# Patient Record
Sex: Male | Born: 1950 | Race: White | Hispanic: No | Marital: Single | Smoking: Never smoker
Health system: Southern US, Community
[De-identification: ages and names within clinical notes are randomized; demographics above are authoritative.]

## PROBLEM LIST (undated history)

## (undated) DIAGNOSIS — R0989 Other specified symptoms and signs involving the circulatory and respiratory systems: Secondary | ICD-10-CM

## (undated) DIAGNOSIS — C4490 Unspecified malignant neoplasm of skin, unspecified: Secondary | ICD-10-CM

## (undated) HISTORY — PX: OTHER SURGICAL HISTORY: SHX169

## (undated) HISTORY — DX: Unspecified malignant neoplasm of skin, unspecified: C44.90

## (undated) HISTORY — DX: Other specified symptoms and signs involving the circulatory and respiratory systems: R09.89

## (undated) HISTORY — PX: CATARACT EXTRACTION: SUR2

---

## 2011-03-29 ENCOUNTER — Encounter: Payer: Self-pay | Admitting: Podiatry

## 2011-03-29 DIAGNOSIS — R0989 Other specified symptoms and signs involving the circulatory and respiratory systems: Secondary | ICD-10-CM

## 2011-03-29 DIAGNOSIS — C4490 Unspecified malignant neoplasm of skin, unspecified: Secondary | ICD-10-CM

## 2011-03-29 DIAGNOSIS — M7750 Other enthesopathy of unspecified foot: Secondary | ICD-10-CM

## 2015-08-05 DIAGNOSIS — G4700 Insomnia, unspecified: Secondary | ICD-10-CM | POA: Insufficient documentation

## 2015-08-05 DIAGNOSIS — M25571 Pain in right ankle and joints of right foot: Secondary | ICD-10-CM | POA: Insufficient documentation

## 2015-08-05 DIAGNOSIS — R6889 Other general symptoms and signs: Secondary | ICD-10-CM | POA: Insufficient documentation

## 2015-08-05 DIAGNOSIS — E663 Overweight: Secondary | ICD-10-CM | POA: Insufficient documentation

## 2015-08-05 DIAGNOSIS — M19079 Primary osteoarthritis, unspecified ankle and foot: Secondary | ICD-10-CM | POA: Insufficient documentation

## 2015-08-05 DIAGNOSIS — R5381 Other malaise: Secondary | ICD-10-CM | POA: Insufficient documentation

## 2015-08-05 DIAGNOSIS — L570 Actinic keratosis: Secondary | ICD-10-CM | POA: Insufficient documentation

## 2015-08-05 DIAGNOSIS — D179 Benign lipomatous neoplasm, unspecified: Secondary | ICD-10-CM | POA: Insufficient documentation

## 2015-08-05 DIAGNOSIS — G2581 Restless legs syndrome: Secondary | ICD-10-CM | POA: Insufficient documentation

## 2015-08-05 DIAGNOSIS — E785 Hyperlipidemia, unspecified: Secondary | ICD-10-CM | POA: Insufficient documentation

## 2015-08-05 DIAGNOSIS — R5383 Other fatigue: Secondary | ICD-10-CM | POA: Insufficient documentation

## 2015-08-05 DIAGNOSIS — Z789 Other specified health status: Secondary | ICD-10-CM | POA: Insufficient documentation

## 2015-10-30 DIAGNOSIS — I861 Scrotal varices: Secondary | ICD-10-CM | POA: Insufficient documentation

## 2015-10-30 DIAGNOSIS — F988 Other specified behavioral and emotional disorders with onset usually occurring in childhood and adolescence: Secondary | ICD-10-CM | POA: Insufficient documentation

## 2015-10-30 DIAGNOSIS — I8393 Asymptomatic varicose veins of bilateral lower extremities: Secondary | ICD-10-CM | POA: Insufficient documentation

## 2015-10-30 DIAGNOSIS — F411 Generalized anxiety disorder: Secondary | ICD-10-CM | POA: Insufficient documentation

## 2016-10-12 DIAGNOSIS — Z0000 Encounter for general adult medical examination without abnormal findings: Secondary | ICD-10-CM | POA: Insufficient documentation

## 2016-10-27 DIAGNOSIS — F4322 Adjustment disorder with anxiety: Secondary | ICD-10-CM | POA: Insufficient documentation

## 2017-04-15 DIAGNOSIS — H93A2 Pulsatile tinnitus, left ear: Secondary | ICD-10-CM | POA: Insufficient documentation

## 2017-05-07 DIAGNOSIS — H903 Sensorineural hearing loss, bilateral: Secondary | ICD-10-CM | POA: Insufficient documentation

## 2017-06-11 ENCOUNTER — Other Ambulatory Visit: Payer: Self-pay | Admitting: Family Medicine

## 2017-06-11 DIAGNOSIS — R161 Splenomegaly, not elsewhere classified: Secondary | ICD-10-CM

## 2017-06-13 ENCOUNTER — Ambulatory Visit
Admission: RE | Admit: 2017-06-13 | Discharge: 2017-06-13 | Disposition: A | Discharge status: Home / Self Care | Payer: Medicare Other | Source: Ambulatory Visit | Attending: Family Medicine | Admitting: Family Medicine

## 2017-06-13 DIAGNOSIS — R161 Splenomegaly, not elsewhere classified: Secondary | ICD-10-CM

## 2017-06-13 MED ORDER — IOPAMIDOL (ISOVUE-300) INJECTION 61%
100.0000 mL | Freq: Once | INTRAVENOUS | Status: AC | PRN
  Administered 2017-06-13: 100 mL via INTRAVENOUS

## 2017-06-18 ENCOUNTER — Telehealth: Payer: Self-pay | Admitting: Hematology

## 2017-06-18 NOTE — Telephone Encounter (Signed)
An urgent referral has been sent from [place] at [facility]. An appt has been scheduled for the pt to see Dr. Irene Limbo on 10/25 at 11am.

## 2017-06-21 ENCOUNTER — Ambulatory Visit (HOSPITAL_BASED_OUTPATIENT_CLINIC_OR_DEPARTMENT_OTHER): Payer: Medicare Other | Admitting: Hematology

## 2017-06-21 ENCOUNTER — Telehealth: Payer: Self-pay | Admitting: Hematology

## 2017-06-21 ENCOUNTER — Ambulatory Visit (HOSPITAL_BASED_OUTPATIENT_CLINIC_OR_DEPARTMENT_OTHER): Payer: Medicare Other

## 2017-06-21 ENCOUNTER — Encounter: Payer: Self-pay | Admitting: Hematology

## 2017-06-21 ENCOUNTER — Other Ambulatory Visit (HOSPITAL_COMMUNITY)
Admission: RE | Admit: 2017-06-21 | Discharge: 2017-06-21 | Disposition: A | Discharge status: Home / Self Care | Payer: Medicare Other | Source: Ambulatory Visit | Attending: Hematology | Admitting: Hematology

## 2017-06-21 VITALS — BP 164/79 | HR 96 | Temp 98.1°F | Resp 18 | Ht 71.0 in | Wt 171.7 lb

## 2017-06-21 DIAGNOSIS — R5383 Other fatigue: Secondary | ICD-10-CM

## 2017-06-21 DIAGNOSIS — Z8052 Family history of malignant neoplasm of bladder: Secondary | ICD-10-CM | POA: Diagnosis not present

## 2017-06-21 DIAGNOSIS — Z809 Family history of malignant neoplasm, unspecified: Secondary | ICD-10-CM

## 2017-06-21 DIAGNOSIS — D72820 Lymphocytosis (symptomatic): Secondary | ICD-10-CM

## 2017-06-21 DIAGNOSIS — R161 Splenomegaly, not elsewhere classified: Secondary | ICD-10-CM

## 2017-06-21 DIAGNOSIS — D696 Thrombocytopenia, unspecified: Secondary | ICD-10-CM | POA: Diagnosis not present

## 2017-06-21 DIAGNOSIS — D649 Anemia, unspecified: Secondary | ICD-10-CM | POA: Diagnosis not present

## 2017-06-21 LAB — CBC & DIFF AND RETIC
BASO%: 0.1 % (ref 0.0–2.0)
Basophils Absolute: 0 10*3/uL (ref 0.0–0.1)
EOS%: 0.3 % (ref 0.0–7.0)
Eosinophils Absolute: 0 10*3/uL (ref 0.0–0.5)
HCT: 35.4 % — ABNORMAL LOW (ref 38.4–49.9)
HGB: 11.4 g/dL — ABNORMAL LOW (ref 13.0–17.1)
Immature Retic Fract: 18.2 % — ABNORMAL HIGH (ref 3.00–10.60)
LYMPH#: 9.3 10*3/uL — AB (ref 0.9–3.3)
LYMPH%: 66.4 % — ABNORMAL HIGH (ref 14.0–49.0)
MCH: 29.6 pg (ref 27.2–33.4)
MCHC: 32.2 g/dL (ref 32.0–36.0)
MCV: 91.9 fL (ref 79.3–98.0)
MONO#: 0.7 10*3/uL (ref 0.1–0.9)
MONO%: 4.9 % (ref 0.0–14.0)
NEUT%: 28.3 % — AB (ref 39.0–75.0)
NEUTROS ABS: 4 10*3/uL (ref 1.5–6.5)
PLATELETS: 96 10*3/uL — AB (ref 140–400)
RBC: 3.85 10*6/uL — AB (ref 4.20–5.82)
RDW: 17 % — ABNORMAL HIGH (ref 11.0–14.6)
RETIC CT ABS: 147.84 10*3/uL — AB (ref 34.80–93.90)
Retic %: 3.84 % — ABNORMAL HIGH (ref 0.80–1.80)
WBC: 14 10*3/uL — AB (ref 4.0–10.3)

## 2017-06-21 LAB — COMPREHENSIVE METABOLIC PANEL
ALT: 18 U/L (ref 0–55)
ANION GAP: 8 meq/L (ref 3–11)
AST: 23 U/L (ref 5–34)
Albumin: 3.7 g/dL (ref 3.5–5.0)
Alkaline Phosphatase: 79 U/L (ref 40–150)
BUN: 17.4 mg/dL (ref 7.0–26.0)
CHLORIDE: 105 meq/L (ref 98–109)
CO2: 27 meq/L (ref 22–29)
Calcium: 9.3 mg/dL (ref 8.4–10.4)
Creatinine: 0.9 mg/dL (ref 0.7–1.3)
EGFR: >60 mL/min/{1.73_m2} (ref >60)
Glucose: 131 mg/dl (ref 70–140)
Potassium: 4.3 mEq/L (ref 3.5–5.1)
Sodium: 140 mEq/L (ref 136–145)
Total Bilirubin: 0.44 mg/dL (ref 0.20–1.20)
Total Protein: 8.1 g/dL (ref 6.4–8.3)

## 2017-06-21 LAB — CHCC SMEAR

## 2017-06-21 LAB — LACTATE DEHYDROGENASE: LDH: 174 U/L (ref 125–245)

## 2017-06-21 LAB — TECHNOLOGIST REVIEW

## 2017-06-21 NOTE — Telephone Encounter (Signed)
Gave avs and calendar for November  °

## 2017-06-21 NOTE — Progress Notes (Signed)
HEMATOLOGY/ONCOLOGY CONSULTATION NOTE  Date of Service: 06/21/2017  Patient Care Team: Christain Sacramento, MD as PCP - General (Family Medicine)  CHIEF COMPLAINTS/PURPOSE OF CONSULTATION:  Splenomegaly with lymphocytosis   HISTORY OF PRESENTING ILLNESS:  Jared Nixon is a wonderful 66 y.o. male who has been referred to [place] by Dr Redmond Pulling, [name], MD for evaluation and management of new splenomegaly with lymphocytosis.  Initially, the patient presented into his PCP's office on 06/11/17 complaining of a small lump under his left lower ribcage which had been present for several weeks. This was palpable, per his PCP's notes and Dr Redmond Pulling subsequently ordered a CT A/P to evaluate this further. This was performed on 06/13/17 which confirmed the presence of splenomegaly and portal caval lymphadenopathy which was concerning for lymphoproliferative disease. He also had lab work (summarized as below) which also showed some gradual leukocytosis/lymphocytosis, with worsening anemia and thrombocytopenia. He denies any pain over the area of his splenomegaly, difficulty eating or premature satiation.   In a general sense, he had not had an significant chronic health issues which he is being treated for. He does struggle with some tinnitus involving only the left ear which began around the end of July and has somewhat worsened since. One year ago he also had bilateral inguinal hernia surgery and a basal cell carcinoma excision. He also notes in the 80s that he had a gynecomastia removed, but otherwise of recently he has been feeling at his baseline state-of-health. He was initially informed that he suffered from leukocytosis earlier this year by Dr Dois Davenport office.   He did smoke for a brief time several decades ago in his 51s (0.5ppd), but none since. He does drink alcohol, and on average he drinks 7-8 beers a week. He denies any chemical/radiaiton exposures or exposure during his career. He does not partake in  illicit drug usage. He has never previously needed a blood transfusion in the past.   He is currently prescribed Clonazepam and Ritalin to use on a daily basis which are both managed by Christain Sacramento, MD. He takes these for dealing with clutter/hoarding issues ongoing within his personal life.   Of note, the patient reports that his father did have bladder cancer. His paternal Aunts all were diagnosed with cancer, but he is unsure of which types. He does have a h/o CVA on his mothers side, but nothing else of note.   On ROS, he does report a 10lbs weight loss which was observed by Dr Redmond Pulling and was attributed to his usage of Ritalin. He also notes that he does suffer from sleeping issues. He does experience fatigue intermittently, however, this is not debilitating or hindering his daily activities. He denies fever, chills, night sweats, abdominal pain, nausea, vomiting, diarrhea, rash, or any other associated symptoms.   MEDICAL HISTORY:  Past Medical History:  Diagnosis Date  . Sinus complaint   . Skin cancer     SURGICAL HISTORY: Past Surgical History:  Procedure Laterality Date  . basal cell carcinoma surgery    . CATARACT EXTRACTION      SOCIAL HISTORY: Social History   Social History  . Marital status: Single    Spouse name: N/A  . Number of children: N/A  . Years of education: N/A   Occupational History  . Not on file.   Social History Main Topics  . Smoking status: Never Smoker  . Smokeless tobacco: Never Used  . Alcohol use Yes     Comment:  occasional  . Drug use: No  . Sexual activity: Not on file   Other Topics Concern  . Not on file   Social History Narrative  . No narrative on file   He is originally from [place] [facility], [place].   FAMILY HISTORY: History reviewed. No pertinent family history.  ALLERGIES:  is allergic to penicillins.  MEDICATIONS:  Current Outpatient Prescriptions  Medication Sig Dispense Refill  . fluticasone (FLONASE) 50 MCG/ACT  nasal spray Place 1 spray into both nostrils daily.    . methylphenidate (RITALIN) 10 MG tablet Take 10 mg by mouth 3 (three) times daily with meals.    . ClonazePAM (KLONOPIN PO) Take by mouth.       No current facility-administered medications for this visit.     REVIEW OF SYSTEMS:    A 10+ POINT REVIEW OF SYSTEMS WAS OBTAINED including neurology, dermatology, psychiatry, cardiac, respiratory, lymph, extremities, GI, GU, Musculoskeletal, constitutional, breasts, reproductive, HEENT.  All pertinent positives are noted in the HPI.  All others are negative.  PHYSICAL EXAMINATION: ECOG PERFORMANCE STATUS: 1 - Symptomatic but completely ambulatory  . Vitals:   06/21/17 1054  BP: (!) 164/79  Pulse: 96  Resp: 18  Temp: 98.1 F (36.7 C)  SpO2: 100%   Filed Weights   06/21/17 1054  Weight: 171 lb 11.2 oz (77.9 kg)   .Body mass index is 23.95 kg/m.  GENERAL:alert, in no acute distress and comfortable SKIN: no acute rashes, no significant lesions EYES: conjunctiva are pink and non-injected, sclera anicteric OROPHARYNX: MMM, no exudates, no oropharyngeal erythema or ulceration NECK: supple, no JVD LYMPH:  no palpable lymphadenopathy in the cervical, axillary or inguinal regions LUNGS: clear to auscultation b/l with normal respiratory effort HEART: regular rate & rhythm ABDOMEN:  normoactive bowel sounds , non tender. Spleen is 5 finger breadths below the left costal margin and the mid-clavicular line.  Extremity: no pedal edema PSYCH: alert & oriented x 3 with fluent speech NEURO: no focal motor/sensory deficits  LABORATORY DATA:  I have reviewed the data as listed  . CBC Latest Ref Rng & Units 06/21/2017  WBC 4.0 - 10.3 10e3/uL 14.0(H)  Hemoglobin 13.0 - 17.1 g/dL 11.4(L)  Hematocrit 38.4 - 49.9 % 35.4(L)  Platelets 140 - 400 10e3/uL 96(L)   . CBC    Component Value Date/Time   WBC 14.0 (H) 06/21/2017 1218   RBC 3.85 (L) 06/21/2017 1218   HGB 11.4 (L) 06/21/2017  1218   HCT 35.4 (L) 06/21/2017 1218   PLT 96 (L) 06/21/2017 1218   MCV 91.9 06/21/2017 1218   MCH 29.6 06/21/2017 1218   MCHC 32.2 06/21/2017 1218   RDW 17.0 (H) 06/21/2017 1218   LYMPHSABS 9.3 (H) 06/21/2017 1218   MONOABS 0.7 06/21/2017 1218   EOSABS 0.0 06/21/2017 1218   BASOSABS 0.0 06/21/2017 1218    . CMP Latest Ref Rng & Units 06/21/2017  Glucose 70 - 140 mg/dl 131  BUN 7.0 - 26.0 mg/dL 17.4  Creatinine 0.7 - 1.3 mg/dL 0.9  Sodium 136 - 145 mEq/L 140  Potassium 3.5 - 5.1 mEq/L 4.3  CO2 22 - 29 mEq/L 27  Calcium 8.4 - 10.4 mg/dL 9.3  Total Protein 6.4 - 8.3 g/dL 8.1  Total Bilirubin 0.20 - 1.20 mg/dL 0.44  Alkaline Phos 40 - 150 U/L 79  AST 5 - 34 U/L 23  ALT 0 - 55 U/L 18      RADIOGRAPHIC STUDIES: I have personally reviewed the radiological images as listed  and agreed with the findings in the report. Ct Abdomen Pelvis W Contrast  Result Date: 06/13/2017 CLINICAL DATA:  Enlarged spleen on physical examination. Status post multiple inguinal surgeries. EXAM: CT ABDOMEN AND PELVIS WITH CONTRAST TECHNIQUE: Multidetector CT imaging of the abdomen and pelvis was performed using the standard protocol following bolus administration of intravenous contrast. CONTRAST:  187m ISOVUE-300 IOPAMIDOL (ISOVUE-300) INJECTION 61% COMPARISON:  None. FINDINGS: LOWER CHEST: Lung bases are clear. Included heart size is normal. No pericardial effusion. HEPATOBILIARY: Liver and gallbladder are normal. PANCREAS: Normal. SPLEEN: Enlarged, 17.7 cm in cranial caudad dimension. ADRENALS/URINARY TRACT: Kidneys are orthotopic, demonstrating symmetric enhancement. Mild mass effect on LEFT kidney due to splenomegaly. No nephrolithiasis, hydronephrosis or solid renal masses. Homogeneously hypodense 2.1 cm benign-appearing cyst upper pole RIGHT kidney and LEFT interpolar kidney. The unopacified ureters are normal in course and caliber. Delayed imaging through the kidneys demonstrates symmetric prompt  contrast excretion within the proximal urinary collecting system. Urinary bladder is well distended and unremarkable. Normal adrenal glands. STOMACH/BOWEL: Very small hiatal hernia, mildly gas distended distal esophagus associated with reflux. The stomach, small and large bowel are normal in course and caliber without inflammatory changes. Mild volume retained large bowel stool. Colonic intra positioning. Normal appendix. VASCULAR/LYMPHATIC: Aortoiliac vessels are normal in course and caliber. Mild calcific atherosclerosis. 17 mm portal caval short axis lymph nodes. 11 mm RIGHT inguinal lymph node. REPRODUCTIVE: Mild prostatomegaly. OTHER: No intraperitoneal free fluid or free air. Status post bilateral inguinal herniorrhaphy with mesh. MUSCULOSKELETAL: Nonacute. Severe L4-5 and L5-S1 degenerative discs resulting in mild canal stenosis L4-5. IMPRESSION: 1. Splenomegaly and portal caval lymphadenopathy concerning for lymphoproliferative disease. 2.  Aortic Atherosclerosis (ICD10-I70.0). 3. These results will be called to the ordering clinician or representative by the Radiologist Assistant, and communication documented in the PACS or zVision Dashboard. Electronically Signed   By: CElon AlasM.D.   On: 06/13/2017 14:36    ASSESSMENT & PLAN:   Jared NOBLETis a  66y.o. male who presents to our clinic to discuss:  #1: Splenomegaly with worsening leukocytosis/lymphocytosis, anemia, and thrombocytopenia. 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 -We discussed the results of his CT scan today in great detail.  -Overall, I informed him that we will need to r/o chronic leukemias/lymphomas. This does not seem to be a very active process as his blood counts have been trending upwards slowly since 2015.  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  PLAN:  -labs today -Flow cytometry on peripheral  blood today to determine a more definitive diagnosis and clonality.  -PET/CT in the near future to evaluate further evaluate and staging his NHL -I did discuss with him that we may need to perform a bone marrow biopsy if initial workup does not give a definitive answer. -treatment decisions based on workup results  Labs today PET/CT within the next week RTC with Dr KIrene Limboin 2 weeks with labs    All of the patients questions were answered with apparent satisfaction. The patient knows to call the clinic with any problems, questions or concerns.  Total time spent on direct patient contact 50 mins . TotaL time spent on clinical encounter 60 mins.   GSullivan LoneMD MState CollegeAAHIVMS SChi Health St. ElizabethCGunnison Valley HospitalHematology/Oncology Physician CMercy Medical Center-D[facility] (Office):       [phone](Work cell):  [phone](Fax):  [phone]  06/21/2017 11:42 AM    ADDENDUM  -flow cytometry consistent with marginal zone lymphoma vs splenic Lymphoma    This document serves as a record of services personally performed by Sullivan Lone, MD. It was created on his behalf by Reola Mosher, a trained medical scribe. The creation of this record is based on the scribe's personal observations and the provider's statements to them. This document has been checked and approved by the attending provider.

## 2017-06-22 LAB — HEPATITIS C ANTIBODY: Hep C Virus Ab: <0.1 s/co ratio (ref 0.0–0.9)

## 2017-06-22 LAB — HEPATITIS B CORE ANTIBODY, TOTAL: Hep B Core Ab, Tot: NEGATIVE

## 2017-06-25 LAB — FLOW CYTOMETRY

## 2017-07-04 ENCOUNTER — Ambulatory Visit (HOSPITAL_COMMUNITY)
Admission: RE | Admit: 2017-07-04 | Discharge: 2017-07-04 | Disposition: A | Discharge status: Home / Self Care | Payer: Medicare Other | Source: Ambulatory Visit | Attending: Hematology | Admitting: Hematology

## 2017-07-04 DIAGNOSIS — I70208 Unspecified atherosclerosis of native arteries of extremities, other extremity: Secondary | ICD-10-CM | POA: Insufficient documentation

## 2017-07-04 DIAGNOSIS — N281 Cyst of kidney, acquired: Secondary | ICD-10-CM | POA: Insufficient documentation

## 2017-07-04 DIAGNOSIS — C4490 Unspecified malignant neoplasm of skin, unspecified: Secondary | ICD-10-CM | POA: Diagnosis not present

## 2017-07-04 DIAGNOSIS — R161 Splenomegaly, not elsewhere classified: Secondary | ICD-10-CM | POA: Diagnosis present

## 2017-07-04 DIAGNOSIS — D72820 Lymphocytosis (symptomatic): Secondary | ICD-10-CM

## 2017-07-04 DIAGNOSIS — I700 Atherosclerosis of aorta: Secondary | ICD-10-CM | POA: Insufficient documentation

## 2017-07-04 LAB — GLUCOSE, CAPILLARY: Glucose-Capillary: 120 mg/dL — ABNORMAL HIGH (ref 65–99)

## 2017-07-04 MED ORDER — FLUDEOXYGLUCOSE F - 18 (FDG) INJECTION
8.4800 | Freq: Once | INTRAVENOUS | Status: AC | PRN
  Administered 2017-07-04: 8.48 via INTRAVENOUS

## 2017-07-09 ENCOUNTER — Other Ambulatory Visit: Payer: Self-pay | Admitting: *Deleted

## 2017-07-09 DIAGNOSIS — D72820 Lymphocytosis (symptomatic): Secondary | ICD-10-CM

## 2017-07-10 ENCOUNTER — Ambulatory Visit (HOSPITAL_BASED_OUTPATIENT_CLINIC_OR_DEPARTMENT_OTHER): Payer: Medicare Other | Admitting: Hematology

## 2017-07-10 ENCOUNTER — Other Ambulatory Visit (HOSPITAL_BASED_OUTPATIENT_CLINIC_OR_DEPARTMENT_OTHER): Payer: Medicare Other

## 2017-07-10 ENCOUNTER — Encounter: Payer: Self-pay | Admitting: Hematology

## 2017-07-10 ENCOUNTER — Telehealth: Payer: Self-pay | Admitting: Hematology

## 2017-07-10 VITALS — BP 122/75 | HR 95 | Temp 98.4°F | Resp 18 | Ht 71.0 in | Wt 168.1 lb

## 2017-07-10 DIAGNOSIS — R161 Splenomegaly, not elsewhere classified: Secondary | ICD-10-CM | POA: Diagnosis not present

## 2017-07-10 DIAGNOSIS — D696 Thrombocytopenia, unspecified: Secondary | ICD-10-CM | POA: Diagnosis not present

## 2017-07-10 DIAGNOSIS — D72820 Lymphocytosis (symptomatic): Secondary | ICD-10-CM

## 2017-07-10 DIAGNOSIS — C8518 Unspecified B-cell lymphoma, lymph nodes of multiple sites: Secondary | ICD-10-CM

## 2017-07-10 DIAGNOSIS — D649 Anemia, unspecified: Secondary | ICD-10-CM

## 2017-07-10 DIAGNOSIS — C8307 Small cell B-cell lymphoma, spleen: Secondary | ICD-10-CM | POA: Diagnosis present

## 2017-07-10 LAB — COMPREHENSIVE METABOLIC PANEL
ALT: 18 U/L (ref 0–55)
AST: 22 U/L (ref 5–34)
Albumin: 3.6 g/dL (ref 3.5–5.0)
Alkaline Phosphatase: 80 U/L (ref 40–150)
Anion Gap: 6 mEq/L (ref 3–11)
BUN: 15.6 mg/dL (ref 7.0–26.0)
CO2: 27 meq/L (ref 22–29)
CREATININE: 0.9 mg/dL (ref 0.7–1.3)
Calcium: 8.8 mg/dL (ref 8.4–10.4)
Chloride: 106 mEq/L (ref 98–109)
GLUCOSE: 122 mg/dL (ref 70–140)
Potassium: 4.2 mEq/L (ref 3.5–5.1)
SODIUM: 139 meq/L (ref 136–145)
TOTAL PROTEIN: 7.9 g/dL (ref 6.4–8.3)
Total Bilirubin: 0.44 mg/dL (ref 0.20–1.20)

## 2017-07-10 LAB — CBC WITH DIFFERENTIAL/PLATELET
BASO%: 0.1 % (ref 0.0–2.0)
BASOS ABS: 0 10*3/uL (ref 0.0–0.1)
EOS%: 0.3 % (ref 0.0–7.0)
Eosinophils Absolute: 0 10*3/uL (ref 0.0–0.5)
HEMATOCRIT: 34.2 % — AB (ref 38.4–49.9)
HGB: 10.9 g/dL — ABNORMAL LOW (ref 13.0–17.1)
LYMPH#: 10 10*3/uL — AB (ref 0.9–3.3)
LYMPH%: 68.8 % — ABNORMAL HIGH (ref 14.0–49.0)
MCH: 29.2 pg (ref 27.2–33.4)
MCHC: 31.9 g/dL — AB (ref 32.0–36.0)
MCV: 91.7 fL (ref 79.3–98.0)
MONO#: 0.7 10*3/uL (ref 0.1–0.9)
MONO%: 4.6 % (ref 0.0–14.0)
NEUT#: 3.8 10*3/uL (ref 1.5–6.5)
NEUT%: 26.2 % — AB (ref 39.0–75.0)
Platelets: 91 10*3/uL — ABNORMAL LOW (ref 140–400)
RBC: 3.73 10*6/uL — ABNORMAL LOW (ref 4.20–5.82)
RDW: 17 % — ABNORMAL HIGH (ref 11.0–14.6)
WBC: 14.6 10*3/uL — ABNORMAL HIGH (ref 4.0–10.3)

## 2017-07-10 LAB — TECHNOLOGIST REVIEW

## 2017-07-10 NOTE — Patient Instructions (Signed)
Thank you for choosing [facility] to provide your oncology and hematology care.  To afford each patient quality time with our providers, please arrive 30 minutes before your scheduled appointment time.  If you arrive late for your appointment, you may be asked to reschedule.  We strive to give you quality time with our providers, and arriving late affects you and other patients whose appointments are after yours.   If you are a no show for multiple scheduled visits, you may be dismissed from the clinic at the providers discretion.    Again, thank you for choosing [facility], our hope is that these requests will decrease the amount of time that you wait before being seen by our physicians.  ______________________________________________________________________  Should you have questions after your visit to the [facility], please contact our office at [phone] between the hours of 8:30 and 4:30 p.m.    Voicemails left after 4:30p.m will not be returned until the following business day.    For prescription refill requests, please have your pharmacy contact us directly.  Please also try to allow 48 hours for prescription requests.    Please contact the scheduling department for questions regarding scheduling.  For scheduling of procedures such as PET scans, CT scans, MRI, Ultrasound, etc please contact central scheduling at [phone].    Resources For Cancer Patients and Caregivers:   Oncolink.org:  A wonderful resource for patients and healthcare providers for information regarding your disease, ways to tract your treatment, what to expect, etc.     American Cancer Society:  800-227-2345  Can help patients locate various types of support and financial assistance  Cancer Care: 1-800-813-HOPE (4673) Provides financial assistance, online support groups, medication/co-pay assistance.    [place] DSS:  336-641-3447 Where to apply for food  stamps, Medicaid, and utility assistance  Medicare Rights Center: 800-333-4114 Helps people with Medicare understand their rights and benefits, navigate the Medicare system, and secure the quality healthcare they deserve  SCAT: 336-333-6589 [place] [employer]'s shared-ride transportation service for eligible riders who have a disability that prevents them from riding the fixed route bus.    For additional information on assistance programs please contact our social worker:   Grier Hock/Abigail Elmore:  [phone]            

## 2017-07-10 NOTE — Telephone Encounter (Signed)
Scheduled appt per 11/13 los - Gave patient AVS and calender per los.  

## 2017-07-16 NOTE — Progress Notes (Signed)
HEMATOLOGY/ONCOLOGY CONSULTATION NOTE  Date of Service: .07/10/2017  Patient Care Team: Jared Sacramento, MD as PCP - General (Family Medicine)  CHIEF COMPLAINTS/PURPOSE OF CONSULTATION:  Splenomegaly with lymphocytosis   HISTORY OF PRESENTING ILLNESS:  Jared Nixon is a wonderful 66 y.o. male who has been referred to [place] by Jared Nixon, Jared Flavors, MD for evaluation and management of new splenomegaly with lymphocytosis.  Initially, the patient presented into his PCP's office on 06/11/17 complaining of a small lump under his left lower ribcage which had been present for several weeks. This was palpable, per his PCP's notes and Jared Nixon subsequently ordered a CT A/P to evaluate this further. This was performed on 06/13/17 which confirmed the presence of splenomegaly and portal caval lymphadenopathy which was concerning for lymphoproliferative disease. He also had lab work (summarized as below) which also showed some gradual leukocytosis/lymphocytosis, with worsening anemia and thrombocytopenia. He denies any pain over the area of his splenomegaly, difficulty eating or premature satiation.   In a general sense, he had not had an significant chronic health issues which he is being treated for. He does struggle with some tinnitus involving only the left ear which began around the end of July and has somewhat worsened since. One year ago he also had bilateral inguinal hernia surgery and a basal cell carcinoma excision. He also notes in the 80s that he had a gynecomastia removed, but otherwise of recently he has been feeling at his baseline state-of-health. He was initially informed that he suffered from leukocytosis earlier this year by Jared Nixon office.   He did smoke for a brief time several decades ago in his 59s (0.5ppd), but none since. He does drink alcohol, and on average he drinks 7-8 beers a week. He denies any chemical/radiaiton exposures or exposure during his career. He does not partake in  illicit drug usage. He has never previously needed a blood transfusion in the past.   He is currently prescribed Clonazepam and Ritalin to use on a daily basis which are both managed by Jared Sacramento, MD. He takes these for dealing with clutter/hoarding issues ongoing within his personal life.   Of note, the patient reports that his father did have bladder cancer. His paternal Aunts all were diagnosed with cancer, but he is unsure of which types. He does have a h/o CVA on his mothers side, but nothing else of note.   On ROS, he does report a 10lbs weight loss which was observed by Jared Nixon and was attributed to his usage of Ritalin. He also notes that he does suffer from sleeping issues. He does experience fatigue intermittently, however, this is not debilitating or hindering his daily activities. He denies fever, chills, night sweats, abdominal pain, nausea, vomiting, diarrhea, rash, or any other associated symptoms.   INTERVAL HISTORY  Patient is here for f/u of his newly diagnosed SMZL presenting with lymphocytosis and mild asymptomaticanemia. I discused with the patient in details all the labs findings and PET/CT results . Patient notes no acute new symptoms. He denies fever, chills, night sweats, abdominal pain, nausea, vomiting, diarrhea, rash, or any other associated symptoms.    MEDICAL HISTORY:  Past Medical History:  Diagnosis Date  . Sinus complaint   . Skin cancer     SURGICAL HISTORY: Past Surgical History:  Procedure Laterality Date  . basal cell carcinoma surgery    . CATARACT EXTRACTION      SOCIAL HISTORY: Social History   Socioeconomic History  .  Marital status: Single    Spouse name: Not on file  . Number of children: Not on file  . Years of education: Not on file  . Highest education level: Not on file  Social Needs  . Financial resource strain: Not on file  . Food insecurity - worry: Not on file  . Food insecurity - inability: Not on file  .  Transportation needs - medical: Not on file  . Transportation needs - non-medical: Not on file  Occupational History  . Not on file  Tobacco Use  . Smoking status: Never Smoker  . Smokeless tobacco: Never Used  Substance and Sexual Activity  . Alcohol use: Yes    Comment: occasional  . Drug use: No  . Sexual activity: Not on file  Other Topics Concern  . Not on file  Social History Narrative  . Not on file   He is originally from [place], [place].   FAMILY HISTORY: No family history on file.  ALLERGIES:  is allergic to penicillins.  MEDICATIONS:  Current Outpatient Medications  Medication Sig Dispense Refill  . ClonazePAM (KLONOPIN PO) Take 1 mg 3 (three) times daily by mouth.     . fluticasone (FLONASE) 50 MCG/ACT nasal spray Place 1 spray into both nostrils daily.    . methylphenidate (RITALIN) 10 MG tablet Take 10 mg 3 (three) times daily by mouth.      No current facility-administered medications for this visit.     REVIEW OF SYSTEMS:    A 10+ POINT REVIEW OF SYSTEMS WAS OBTAINED including neurology, dermatology, psychiatry, cardiac, respiratory, lymph, extremities, GI, GU, Musculoskeletal, constitutional, breasts, reproductive, HEENT.  All pertinent positives are noted in the HPI.  All others are negative.  PHYSICAL EXAMINATION: ECOG PERFORMANCE STATUS: 1 - Symptomatic but completely ambulatory  . Vitals:   07/10/17 1401  BP: 122/75  Pulse: 95  Resp: 18  Temp: 98.4 F (36.9 C)  SpO2: 100%   Filed Weights   07/10/17 1401  Weight: 168 lb 1.6 oz (76.2 kg)   .Body mass index is 23.45 kg/m.  GENERAL:alert, in no acute distress and comfortable SKIN: no acute rashes, no significant lesions EYES: conjunctiva are pink and non-injected, sclera anicteric OROPHARYNX: MMM, no exudates, no oropharyngeal erythema or ulceration NECK: supple, no JVD LYMPH:  no palpable lymphadenopathy in the cervical, axillary or inguinal regions LUNGS: clear to auscultation b/l  with normal respiratory effort HEART: regular rate & rhythm ABDOMEN:  normoactive bowel sounds , non tender. Spleen is 5 finger breadths below the left costal margin and the mid-clavicular line.  Extremity: no pedal edema PSYCH: alert & oriented x 3 with fluent speech NEURO: no focal motor/sensory deficits  LABORATORY DATA:  I have reviewed the data as listed  . CBC Latest Ref Rng & Units 07/10/2017 06/21/2017  WBC 4.0 - 10.3 10e3/uL 14.6(H) 14.0(H)  Hemoglobin 13.0 - 17.1 g/dL 10.9(L) 11.4(L)  Hematocrit 38.4 - 49.9 % 34.2(L) 35.4(L)  Platelets 140 - 400 10e3/uL 91(L) 96(L)   . CBC    Component Value Date/Time   WBC 14.6 (H) 07/10/2017 1321   RBC 3.73 (L) 07/10/2017 1321   HGB 10.9 (L) 07/10/2017 1321   HCT 34.2 (L) 07/10/2017 1321   PLT 91 (L) 07/10/2017 1321   MCV 91.7 07/10/2017 1321   MCH 29.2 07/10/2017 1321   MCHC 31.9 (L) 07/10/2017 1321   RDW 17.0 (H) 07/10/2017 1321   LYMPHSABS 10.0 (H) 07/10/2017 1321   MONOABS 0.7 07/10/2017 1321  EOSABS 0.0 07/10/2017 1321   BASOSABS 0.0 07/10/2017 1321    . CMP Latest Ref Rng & Units 07/10/2017 06/21/2017  Glucose 70 - 140 mg/dl 122 131  BUN 7.0 - 26.0 mg/dL 15.6 17.4  Creatinine 0.7 - 1.3 mg/dL 0.9 0.9  Sodium 136 - 145 mEq/L 139 140  Potassium 3.5 - 5.1 mEq/L 4.2 4.3  CO2 22 - 29 mEq/L 27 27  Calcium 8.4 - 10.4 mg/dL 8.8 9.3  Total Protein 6.4 - 8.3 g/dL 7.9 8.1  Total Bilirubin 0.20 - 1.20 mg/dL 0.44 0.44  Alkaline Phos 40 - 150 U/L 80 79  AST 5 - 34 U/L 22 23  ALT 0 - 55 U/L 18 18       -flow cytometry consistent with marginal zone lymphoma vs splenic Lymphoma      RADIOGRAPHIC STUDIES: I have personally reviewed the radiological images as listed and agreed with the findings in the report. Nm Pet Image Initial (pi) Skull Base To Thigh  Result Date: 07/04/2017 CLINICAL DATA:  Initial treatment strategy for marginal zone lymphoma EXAM: NUCLEAR MEDICINE PET SKULL BASE TO THIGH TECHNIQUE: 8.5 mCi F-18  FDG was injected intravenously. Full-ring PET imaging was performed from the skull base to thigh after the radiotracer. CT data was obtained and used for attenuation correction and anatomic localization. FASTING BLOOD GLUCOSE:  Value: 120 mg/dl COMPARISON:  CT scan from 06/13/2017 FINDINGS: NECK Primarily low grade activity in scattered level IIa, level IIb, level III, level IV, and level V lymph nodes which are generally not significant increase in size but are increased in number. A representative index right level V lymph node measures 1.1 cm in short axis on image 36/4 and has a maximum standard uptake value of 2.0. CHEST Bilateral axillary and subpectoral adenopathy is present. A representative solid-appearing left axillary lymph node measures 1.6 cm in short axis on image 61/4 with maximum SUV 4.5. Background mediastinal blood pool activity is SUV 2.3. Small mediastinal lymph nodes are not appreciably hypermetabolic. No significant pulmonary nodules are identified. ABDOMEN/PELVIS The spleen measures 16.8 by 7.3 by 17.2 cm (volume = 1100 cm^3) and a representative portion of the spleen has a maximum SUV of 2.9. Background hepatic activity SUV is 3.2. Porta hepatis adenopathy is present, including a portacaval node measuring 1.7 cm in short axis on image 121/4 with a maximum SUV of 2.9. Photopenic right posterior renal cyst and photopenic left lateral renal cyst. Thick-walled urinary bladder although this may be from nondistention. Accentuated activity along the lower abdominal wall hernia mesh. Aortoiliac atherosclerotic vascular disease. SKELETON No focal hypermetabolic activity to suggest skeletal metastasis. IMPRESSION: 1. Prominent splenomegaly with low-grade (Deauville 3) activity diffusely in the spleen. 2. Increased number of scattered lymph nodes in the neck as detailed above, most of which are below pathologic size thresholds, and demonstrate Deauville 2 activity. 3. Axillary and subpectoral lymph  nodes are enlarged and demonstrate Deauville 4 activity. 4. Mildly prominent porta hepatis lymph nodes demonstrate Deauville 3 activity. 5.  Aortic Atherosclerosis (ICD10-I70.0). Electronically Signed   By: Van Clines M.D.   On: 07/04/2017 13:39    ASSESSMENT & PLAN:   CLEMENT DENEAULT is a  66 y.o. 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 -we discuss the diagnosis, natural history, prognosis, treatment indications and options. -patient had a number of questions which  were answered in details. -no immediate indication for treatment at this time. He does have some anemia and thrombocytopenia as well as splenomegaly and so will need to monitor closely and consider treatment with Rituxan if symptoms/worsening cytopenias.  RTC with Jared Irene Limbo in 1st week of Jan 2019 with labs     All of the patients questions were answered with apparent satisfaction. The patient knows to call the clinic with any problems, questions or concerns. Total time spent on direct patient contact 25 mins . TotaL time spent on clinical encounter 30 mins.   Sullivan Lone MD [place] AAHIVMS [facility] Hematology/Oncology Physician [facility]  (Office):       [phone] (Work cell):  [phone] (Fax):           [phone]  07/16/2017 3:15 AM

## 2017-08-31 ENCOUNTER — Ambulatory Visit (HOSPITAL_BASED_OUTPATIENT_CLINIC_OR_DEPARTMENT_OTHER): Payer: Medicare Other | Admitting: Hematology

## 2017-08-31 ENCOUNTER — Encounter: Payer: Self-pay | Admitting: Hematology

## 2017-08-31 ENCOUNTER — Telehealth: Payer: Self-pay | Admitting: Hematology

## 2017-08-31 ENCOUNTER — Other Ambulatory Visit (HOSPITAL_BASED_OUTPATIENT_CLINIC_OR_DEPARTMENT_OTHER): Payer: Medicare Other

## 2017-08-31 VITALS — BP 162/80 | HR 78 | Temp 98.1°F | Resp 18 | Ht 71.0 in | Wt 165.6 lb

## 2017-08-31 DIAGNOSIS — D696 Thrombocytopenia, unspecified: Secondary | ICD-10-CM | POA: Diagnosis not present

## 2017-08-31 DIAGNOSIS — C8518 Unspecified B-cell lymphoma, lymph nodes of multiple sites: Secondary | ICD-10-CM

## 2017-08-31 DIAGNOSIS — D649 Anemia, unspecified: Secondary | ICD-10-CM | POA: Diagnosis not present

## 2017-08-31 DIAGNOSIS — R61 Generalized hyperhidrosis: Secondary | ICD-10-CM | POA: Diagnosis not present

## 2017-08-31 DIAGNOSIS — R161 Splenomegaly, not elsewhere classified: Secondary | ICD-10-CM

## 2017-08-31 DIAGNOSIS — R5383 Other fatigue: Secondary | ICD-10-CM | POA: Diagnosis not present

## 2017-08-31 DIAGNOSIS — D72820 Lymphocytosis (symptomatic): Secondary | ICD-10-CM | POA: Diagnosis not present

## 2017-08-31 DIAGNOSIS — Z7189 Other specified counseling: Secondary | ICD-10-CM

## 2017-08-31 DIAGNOSIS — C8307 Small cell B-cell lymphoma, spleen: Secondary | ICD-10-CM

## 2017-08-31 DIAGNOSIS — R634 Abnormal weight loss: Secondary | ICD-10-CM | POA: Diagnosis not present

## 2017-08-31 LAB — CBC & DIFF AND RETIC
BASO%: 0.2 % (ref 0.0–2.0)
Basophils Absolute: 0 10*3/uL (ref 0.0–0.1)
EOS%: 0.4 % (ref 0.0–7.0)
Eosinophils Absolute: 0.1 10*3/uL (ref 0.0–0.5)
HCT: 33.8 % — ABNORMAL LOW (ref 38.4–49.9)
HGB: 11 g/dL — ABNORMAL LOW (ref 13.0–17.1)
Immature Retic Fract: 12.7 % — ABNORMAL HIGH (ref 3.00–10.60)
LYMPH%: 70.9 % — AB (ref 14.0–49.0)
MCH: 29.6 pg (ref 27.2–33.4)
MCHC: 32.5 g/dL (ref 32.0–36.0)
MCV: 91.1 fL (ref 79.3–98.0)
MONO#: 0.7 10*3/uL (ref 0.1–0.9)
MONO%: 4.6 % (ref 0.0–14.0)
NEUT%: 23.9 % — ABNORMAL LOW (ref 39.0–75.0)
NEUTROS ABS: 3.9 10*3/uL (ref 1.5–6.5)
Platelets: 84 10*3/uL — ABNORMAL LOW (ref 140–400)
RBC: 3.71 10*6/uL — AB (ref 4.20–5.82)
RDW: 17.3 % — AB (ref 11.0–14.6)
Retic %: 3.32 % — ABNORMAL HIGH (ref 0.80–1.80)
Retic Ct Abs: 123.17 10*3/uL — ABNORMAL HIGH (ref 34.80–93.90)
WBC: 16.1 10*3/uL — AB (ref 4.0–10.3)
lymph#: 11.4 10*3/uL — ABNORMAL HIGH (ref 0.9–3.3)

## 2017-08-31 LAB — COMPREHENSIVE METABOLIC PANEL
ALBUMIN: 3.6 g/dL (ref 3.5–5.0)
ALK PHOS: 78 U/L (ref 40–150)
ALT: 20 U/L (ref 0–55)
ANION GAP: 6 meq/L (ref 3–11)
AST: 22 U/L (ref 5–34)
BUN: 24.1 mg/dL (ref 7.0–26.0)
CALCIUM: 9.2 mg/dL (ref 8.4–10.4)
CO2: 28 mEq/L (ref 22–29)
CREATININE: 0.9 mg/dL (ref 0.7–1.3)
Chloride: 102 mEq/L (ref 98–109)
EGFR: >60 mL/min/{1.73_m2} (ref >60)
Glucose: 127 mg/dl (ref 70–140)
POTASSIUM: 4.2 meq/L (ref 3.5–5.1)
Sodium: 136 mEq/L (ref 136–145)
Total Bilirubin: 0.46 mg/dL (ref 0.20–1.20)
Total Protein: 7.8 g/dL (ref 6.4–8.3)

## 2017-08-31 LAB — LACTATE DEHYDROGENASE: LDH: 168 U/L (ref 125–245)

## 2017-08-31 LAB — TECHNOLOGIST REVIEW

## 2017-08-31 NOTE — Telephone Encounter (Signed)
Gave avs and calendar for January  °

## 2017-08-31 NOTE — Progress Notes (Signed)
HEMATOLOGY/ONCOLOGY CLINIC NOTE  Date of Service: 08/31/17  Patient Care Team: Christain Sacramento, MD as PCP - General (Family Medicine)  CHIEF COMPLAINTS/PURPOSE OF CONSULTATION:  F/u for SMZL  HISTORY OF PRESENTING ILLNESS:  Jared Nixon is a wonderful 67 y.o. male who has been referred to [place] by Dr Redmond Pulling, [name], MD for evaluation and management of new splenomegaly with lymphocytosis.  Initially, the patient presented into his PCP's office on 06/11/17 complaining of a small lump under his left lower ribcage which had been present for several weeks. This was palpable, per his PCP's notes and Dr Redmond Pulling subsequently ordered a CT A/P to evaluate this further. This was performed on 06/13/17 which confirmed the presence of splenomegaly and portal caval lymphadenopathy which was concerning for lymphoproliferative disease. He also had lab work (summarized as below) which also showed some gradual leukocytosis/lymphocytosis, with worsening anemia and thrombocytopenia. He denies any pain over the area of his splenomegaly, difficulty eating or premature satiation.   In a general sense, he had not had an significant chronic health issues which he is being treated for. He does struggle with some tinnitus involving only the left ear which began around the end of July and has somewhat worsened since. One year ago he also had bilateral inguinal hernia surgery and a basal cell carcinoma excision. He also notes in the 80s that he had a gynecomastia removed, but otherwise of recently he has been feeling at his baseline state-of-health. He was initially informed that he suffered from leukocytosis earlier this year by Dr Dois Davenport office.   He did smoke for a brief time several decades ago in his 64s (0.5ppd), but none since. He does drink alcohol, and on average he drinks 7-8 beers a week. He denies any chemical/radiaiton exposures or exposure during his career. He does not partake in illicit drug usage. He has  never previously needed a blood transfusion in the past.   He is currently prescribed Clonazepam and Ritalin to use on a daily basis which are both managed by Christain Sacramento, MD. He takes these for dealing with clutter/hoarding issues ongoing within his personal life.   Of note, the patient reports that his father did have bladder cancer. His paternal Aunts all were diagnosed with cancer, but he is unsure of which types. He does have a h/o CVA on his mothers side, but nothing else of note.   On ROS, he does report a 10lbs weight loss which was observed by Dr Redmond Pulling and was attributed to his usage of Ritalin. He also notes that he does suffer from sleeping issues. He does experience fatigue intermittently, however, this is not debilitating or hindering his daily activities. He denies fever, chills, night sweats, abdominal pain, nausea, vomiting, diarrhea, rash, or any other associated symptoms.   INTERVAL HISTORY  Patient is here for f/u of his newly diagnosed SMZL presenting with lymphocytosis and mild asymptomatic anemia and thrombocytopenia. . He has been overall well in the interim. His labs today 08/31/2017 show: WBC 16.1, hgb 11, platelets 84k. [name] Kitchen  His lymphocyte counts are increasing and his platelet counts are decreasing. On review of systems, pt reports fatigue, weight loss, occasional hot flashes and denies fever, chills, abdominal pain, abnormal bleeding/bruising and any other accompanying symptoms.   MEDICAL HISTORY:  Past Medical History:  Diagnosis Date  . Sinus complaint   . Skin cancer     SURGICAL HISTORY: Past Surgical History:  Procedure Laterality Date  . basal cell  carcinoma surgery    . CATARACT EXTRACTION      SOCIAL HISTORY: Social History   Socioeconomic History  . Marital status: Single    Spouse name: Not on file  . Number of children: Not on file  . Years of education: Not on file  . Highest education level: Not on file  Social Needs  . Financial  resource strain: Not on file  . Food insecurity - worry: Not on file  . Food insecurity - inability: Not on file  . Transportation needs - medical: Not on file  . Transportation needs - non-medical: Not on file  Occupational History  . Not on file  Tobacco Use  . Smoking status: Never Smoker  . Smokeless tobacco: Never Used  Substance and Sexual Activity  . Alcohol use: Yes    Comment: occasional  . Drug use: No  . Sexual activity: Not on file  Other Topics Concern  . Not on file  Social History Narrative  . Not on file   He is originally from [place], [place].   FAMILY HISTORY: No family history on file.  ALLERGIES:  is allergic to penicillins.  MEDICATIONS:  Current Outpatient Medications  Medication Sig Dispense Refill  . ClonazePAM (KLONOPIN PO) Take 1 mg 3 (three) times daily by mouth.     . fluticasone (FLONASE) 50 MCG/ACT nasal spray Place 1 spray into both nostrils daily.    . methylphenidate (RITALIN) 10 MG tablet Take 10 mg 3 (three) times daily by mouth.      No current facility-administered medications for this visit.     REVIEW OF SYSTEMS:    A 10+ POINT REVIEW OF SYSTEMS WAS OBTAINED including neurology, dermatology, psychiatry, cardiac, respiratory, lymph, extremities, GI, GU, Musculoskeletal, constitutional, breasts, reproductive, HEENT.  All pertinent positives are noted in the HPI.  All others are negative.  PHYSICAL EXAMINATION: ECOG PERFORMANCE STATUS: 1 - Symptomatic but completely ambulatory  . Vitals:   08/31/17 1332  BP: (!) 162/80  Pulse: 78  Resp: 18  Temp: 98.1 F (36.7 C)  SpO2: 100%   Filed Weights   08/31/17 1332  Weight: 165 lb 9.6 oz (75.1 kg)   .Body mass index is 23.1 kg/m.  GENERAL:alert, in no acute distress and comfortable SKIN: no acute rashes, no significant lesions EYES: conjunctiva are pink and non-injected, sclera anicteric OROPHARYNX: MMM, no exudates, no oropharyngeal erythema or ulceration NECK: supple, no  JVD LYMPH:  no palpable lymphadenopathy in the cervical, axillary or inguinal regions LUNGS: clear to auscultation b/l with normal respiratory effort HEART: regular rate & rhythm ABDOMEN:  normoactive bowel sounds , non tender. Spleen is 5 finger breadths below the left costal margin and the mid-clavicular line.  Extremity: no pedal edema PSYCH: alert & oriented x 3 with fluent speech NEURO: no focal motor/sensory deficits  LABORATORY DATA:  I have reviewed the data as listed  . CBC Latest Ref Rng & Units 08/31/2017 07/10/2017 06/21/2017  WBC 4.0 - 10.3 10e3/uL 16.1(H) 14.6(H) 14.0(H)  Hemoglobin 13.0 - 17.1 g/dL 11.0(L) 10.9(L) 11.4(L)  Hematocrit 38.4 - 49.9 % 33.8(L) 34.2(L) 35.4(L)  Platelets 140 - 400 10e3/uL 84(L) 91(L) 96(L)   . CBC    Component Value Date/Time   WBC 16.1 (H) 08/31/2017 1101   RBC 3.71 (L) 08/31/2017 1101   HGB 11.0 (L) 08/31/2017 1101   HCT 33.8 (L) 08/31/2017 1101   PLT 84 (L) 08/31/2017 1101   MCV 91.1 08/31/2017 1101   MCH 29.6 08/31/2017 1101  MCHC 32.5 08/31/2017 1101   RDW 17.3 (H) 08/31/2017 1101   LYMPHSABS 11.4 (H) 08/31/2017 1101   MONOABS 0.7 08/31/2017 1101   EOSABS 0.1 08/31/2017 1101   BASOSABS 0.0 08/31/2017 1101    . CMP Latest Ref Rng & Units 07/10/2017 06/21/2017  Glucose 70 - 140 mg/dl 122 131  BUN 7.0 - 26.0 mg/dL 15.6 17.4  Creatinine 0.7 - 1.3 mg/dL 0.9 0.9  Sodium 136 - 145 mEq/L 139 140  Potassium 3.5 - 5.1 mEq/L 4.2 4.3  CO2 22 - 29 mEq/L 27 27  Calcium 8.4 - 10.4 mg/dL 8.8 9.3  Total Protein 6.4 - 8.3 g/dL 7.9 8.1  Total Bilirubin 0.20 - 1.20 mg/dL 0.44 0.44  Alkaline Phos 40 - 150 U/L 80 79  AST 5 - 34 U/L 22 23  ALT 0 - 55 U/L 18 18     -flow cytometry consistent with marginal zone lymphoma vs splenic Lymphoma      RADIOGRAPHIC STUDIES: I have personally reviewed the radiological images as listed and agreed with the findings in the report. No results found.  ASSESSMENT & PLAN:   Jared Nixon is a   67 y.o. 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 -he has develop some fatigue, night sweat and in addition also noted to have increasing lymphocytosis with worsening thrombocytopenia and mild anemia. -given these findings we discussed that he would meet criteria to consider treatment of his SMZL as opposed to a watch and wait approach at this time. -Explained Rituxan 4 doses 1x weekly and discussed risks versus benefits -Schedule for Chemo counseling for Riutxan -Plan to begin treatment in 1 week  Chemo-counseling for Rituxan rx for Splenic marginal zone lymphoma Weekly labs with each Rituxan dose Starting Rituxan weekly x 4 doses in 1 week RTC with Dr Irene Limbo with 2nd dose of Rituxan with labs for  Toxicity check  All of the patients questions were answered with apparent satisfaction. The patient knows to call the clinic with any problems, questions or concerns. Total time spent on direct patient contact 25 mins . TotaL time spent on clinical encounter 30 mins.   Jared Lone MD [place] AAHIVMS [facility] Hematology/Oncology Physician [facility]  (Office):       [phone] (Work cell):  [phone] (Fax):           [phone]  08/31/2017 9:55 AM   This document serves as a record of services personally performed by Jared Lone, MD. It was created on her behalf by Alean Rinne, a trained medical scribe. The creation of this record is based on the scribe's personal observations and the provider's statements to them. This document has been checked and approved by the attending provider.  .I have reviewed the above documentation for accuracy and completeness, and I agree with the above. Brunetta Genera MD MS

## 2017-08-31 NOTE — Patient Instructions (Signed)
Thank you for choosing [facility] to provide your oncology and hematology care.  To afford each patient quality time with our providers, please arrive 30 minutes before your scheduled appointment time.  If you arrive late for your appointment, you may be asked to reschedule.  We strive to give you quality time with our providers, and arriving late affects you and other patients whose appointments are after yours.  If you are a no show for multiple scheduled visits, you may be dismissed from the clinic at the providers discretion.   Again, thank you for choosing [facility], our hope is that these requests will decrease the amount of time that you wait before being seen by our physicians.  ______________________________________________________________________ Should you have questions after your visit to the [facility], please contact our office at [phone] between the hours of 8:30 and 4:30 p.m.    Voicemails left after 4:30p.m will not be returned until the following business day.   For prescription refill requests, please have your pharmacy contact us directly.  Please also try to allow 48 hours for prescription requests.   Please contact the scheduling department for questions regarding scheduling.  For scheduling of procedures such as PET scans, CT scans, MRI, Ultrasound, etc please contact central scheduling at [phone].   Resources For Cancer Patients and Caregivers:  American Cancer Society:  800-227-2345  Can help patients locate various types of support and financial assistance Cancer Care: 1-800-813-HOPE (4673) Provides financial assistance, online support groups, medication/co-pay assistance.   [place] DSS:  336-641-3447 Where to apply for food stamps, Medicaid, and utility assistance Medicare Rights Center: 800-333-4114 Helps people with Medicare understand their rights and benefits, navigate the Medicare system, and secure the  quality healthcare they deserve SCAT: 336-333-6589 [place] [employer]'s shared-ride transportation service for eligible riders who have a disability that prevents them from riding the fixed route bus.   For additional information on assistance programs please contact our social worker:   Grier Hock/Abigail Elmore:  [phone] 

## 2017-09-01 DIAGNOSIS — C8307 Small cell B-cell lymphoma, spleen: Secondary | ICD-10-CM | POA: Insufficient documentation

## 2017-09-01 NOTE — Progress Notes (Signed)
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 

## 2017-09-02 DIAGNOSIS — Z7189 Other specified counseling: Secondary | ICD-10-CM | POA: Insufficient documentation

## 2017-09-03 ENCOUNTER — Inpatient Hospital Stay: Payer: Medicare Other | Attending: Hematology

## 2017-09-03 DIAGNOSIS — Z85828 Personal history of other malignant neoplasm of skin: Secondary | ICD-10-CM | POA: Insufficient documentation

## 2017-09-03 DIAGNOSIS — I10 Essential (primary) hypertension: Secondary | ICD-10-CM | POA: Insufficient documentation

## 2017-09-03 DIAGNOSIS — I7300 Raynaud's syndrome without gangrene: Secondary | ICD-10-CM | POA: Insufficient documentation

## 2017-09-03 DIAGNOSIS — Z809 Family history of malignant neoplasm, unspecified: Secondary | ICD-10-CM | POA: Insufficient documentation

## 2017-09-03 DIAGNOSIS — Z8052 Family history of malignant neoplasm of bladder: Secondary | ICD-10-CM | POA: Insufficient documentation

## 2017-09-03 DIAGNOSIS — Z79899 Other long term (current) drug therapy: Secondary | ICD-10-CM | POA: Insufficient documentation

## 2017-09-03 DIAGNOSIS — K4020 Bilateral inguinal hernia, without obstruction or gangrene, not specified as recurrent: Secondary | ICD-10-CM | POA: Insufficient documentation

## 2017-09-03 DIAGNOSIS — C8580 Other specified types of non-Hodgkin lymphoma, unspecified site: Secondary | ICD-10-CM | POA: Insufficient documentation

## 2017-09-03 DIAGNOSIS — Z5112 Encounter for antineoplastic immunotherapy: Secondary | ICD-10-CM | POA: Insufficient documentation

## 2017-09-03 DIAGNOSIS — D696 Thrombocytopenia, unspecified: Secondary | ICD-10-CM | POA: Insufficient documentation

## 2017-09-03 DIAGNOSIS — D72820 Lymphocytosis (symptomatic): Secondary | ICD-10-CM | POA: Insufficient documentation

## 2017-09-03 DIAGNOSIS — R231 Pallor: Secondary | ICD-10-CM | POA: Insufficient documentation

## 2017-09-03 DIAGNOSIS — R161 Splenomegaly, not elsewhere classified: Secondary | ICD-10-CM | POA: Insufficient documentation

## 2017-09-03 DIAGNOSIS — R6883 Chills (without fever): Secondary | ICD-10-CM | POA: Insufficient documentation

## 2017-09-07 ENCOUNTER — Other Ambulatory Visit: Payer: Self-pay | Admitting: *Deleted

## 2017-09-07 DIAGNOSIS — C8518 Unspecified B-cell lymphoma, lymph nodes of multiple sites: Secondary | ICD-10-CM

## 2017-09-11 ENCOUNTER — Inpatient Hospital Stay: Payer: Medicare Other

## 2017-09-11 ENCOUNTER — Other Ambulatory Visit: Payer: Self-pay

## 2017-09-11 ENCOUNTER — Ambulatory Visit (HOSPITAL_BASED_OUTPATIENT_CLINIC_OR_DEPARTMENT_OTHER): Payer: Medicare Other | Admitting: Medical

## 2017-09-11 ENCOUNTER — Other Ambulatory Visit: Payer: Self-pay | Admitting: Medical

## 2017-09-11 VITALS — BP 129/71 | HR 97 | Temp 99.1°F | Resp 18

## 2017-09-11 DIAGNOSIS — Z85828 Personal history of other malignant neoplasm of skin: Secondary | ICD-10-CM | POA: Diagnosis not present

## 2017-09-11 DIAGNOSIS — Z5112 Encounter for antineoplastic immunotherapy: Secondary | ICD-10-CM | POA: Diagnosis present

## 2017-09-11 DIAGNOSIS — R161 Splenomegaly, not elsewhere classified: Secondary | ICD-10-CM | POA: Diagnosis not present

## 2017-09-11 DIAGNOSIS — Z79899 Other long term (current) drug therapy: Secondary | ICD-10-CM | POA: Diagnosis not present

## 2017-09-11 DIAGNOSIS — Z8052 Family history of malignant neoplasm of bladder: Secondary | ICD-10-CM | POA: Diagnosis not present

## 2017-09-11 DIAGNOSIS — K4020 Bilateral inguinal hernia, without obstruction or gangrene, not specified as recurrent: Secondary | ICD-10-CM | POA: Diagnosis not present

## 2017-09-11 DIAGNOSIS — C8580 Other specified types of non-Hodgkin lymphoma, unspecified site: Secondary | ICD-10-CM

## 2017-09-11 DIAGNOSIS — D72820 Lymphocytosis (symptomatic): Secondary | ICD-10-CM | POA: Diagnosis not present

## 2017-09-11 DIAGNOSIS — C8518 Unspecified B-cell lymphoma, lymph nodes of multiple sites: Secondary | ICD-10-CM

## 2017-09-11 DIAGNOSIS — D696 Thrombocytopenia, unspecified: Secondary | ICD-10-CM | POA: Diagnosis not present

## 2017-09-11 DIAGNOSIS — I7300 Raynaud's syndrome without gangrene: Secondary | ICD-10-CM

## 2017-09-11 DIAGNOSIS — C8307 Small cell B-cell lymphoma, spleen: Secondary | ICD-10-CM

## 2017-09-11 DIAGNOSIS — I10 Essential (primary) hypertension: Secondary | ICD-10-CM | POA: Diagnosis not present

## 2017-09-11 DIAGNOSIS — Z7189 Other specified counseling: Secondary | ICD-10-CM

## 2017-09-11 DIAGNOSIS — R231 Pallor: Secondary | ICD-10-CM | POA: Diagnosis not present

## 2017-09-11 DIAGNOSIS — R6883 Chills (without fever): Secondary | ICD-10-CM

## 2017-09-11 DIAGNOSIS — Z809 Family history of malignant neoplasm, unspecified: Secondary | ICD-10-CM | POA: Diagnosis not present

## 2017-09-11 LAB — COMPREHENSIVE METABOLIC PANEL
ALK PHOS: 84 U/L (ref 40–150)
ALT: 21 U/L (ref 0–55)
AST: 22 U/L (ref 5–34)
Albumin: 3.4 g/dL — ABNORMAL LOW (ref 3.5–5.0)
Anion gap: 8 (ref 3–11)
BUN: 22 mg/dL (ref 7–26)
CALCIUM: 9.2 mg/dL (ref 8.4–10.4)
CO2: 24 mmol/L (ref 22–29)
CREATININE: 0.83 mg/dL (ref 0.70–1.30)
Chloride: 107 mmol/L (ref 98–109)
Glucose, Bld: 135 mg/dL (ref 70–140)
Potassium: 4 mmol/L (ref 3.5–5.1)
Sodium: 139 mmol/L (ref 136–145)
Total Bilirubin: 0.4 mg/dL (ref 0.2–1.2)
Total Protein: 7.7 g/dL (ref 6.4–8.3)

## 2017-09-11 LAB — CBC WITH DIFFERENTIAL/PLATELET
Basophils Absolute: 0 10*3/uL (ref 0.0–0.1)
Basophils Relative: 0 %
EOS ABS: 0.1 10*3/uL (ref 0.0–0.5)
EOS PCT: 0 %
HCT: 33.2 % — ABNORMAL LOW (ref 38.4–49.9)
Hemoglobin: 10.8 g/dL — ABNORMAL LOW (ref 13.0–17.1)
LYMPHS ABS: 12 10*3/uL — AB (ref 0.9–3.3)
Lymphocytes Relative: 73 %
MCH: 30.1 pg (ref 27.2–33.4)
MCHC: 32.5 g/dL (ref 32.0–36.0)
MCV: 92.5 fL (ref 79.3–98.0)
MONO ABS: 0.7 10*3/uL (ref 0.1–0.9)
MONOS PCT: 4 %
Neutro Abs: 3.8 10*3/uL (ref 1.5–6.5)
Neutrophils Relative %: 23 %
PLATELETS: 99 10*3/uL — AB (ref 140–400)
RBC: 3.59 MIL/uL — ABNORMAL LOW (ref 4.20–5.82)
RDW: 17.2 % — AB (ref 11.0–15.6)
WBC: 16.6 10*3/uL — ABNORMAL HIGH (ref 4.0–10.3)

## 2017-09-11 LAB — RETICULOCYTES
RBC.: 3.59 MIL/uL — ABNORMAL LOW (ref 4.20–5.82)
Retic Count, Absolute: 107.7 10*3/uL — ABNORMAL HIGH (ref 34.8–93.9)
Retic Ct Pct: 3 % — ABNORMAL HIGH (ref 0.8–1.8)

## 2017-09-11 LAB — URIC ACID: Uric Acid, Serum: 7.4 mg/dL (ref 2.6–7.4)

## 2017-09-11 MED ORDER — FAMOTIDINE IN NACL 20-0.9 MG/50ML-% IV SOLN
20.0000 mg | Freq: Once | INTRAVENOUS | Status: AC
  Administered 2017-09-11: 20 mg via INTRAVENOUS

## 2017-09-11 MED ORDER — AMLODIPINE BESYLATE 5 MG PO TABS
5.0000 mg | ORAL_TABLET | Freq: Once | ORAL | Status: DC
  Filled 2017-09-11: qty 1

## 2017-09-11 MED ORDER — ACETAMINOPHEN 325 MG PO TABS
650.0000 mg | ORAL_TABLET | Freq: Once | ORAL | Status: AC
  Administered 2017-09-11: 650 mg via ORAL

## 2017-09-11 MED ORDER — SODIUM CHLORIDE 0.9 % IV SOLN
375.0000 mg/m2 | Freq: Once | INTRAVENOUS | Status: AC
  Administered 2017-09-11: 700 mg via INTRAVENOUS
  Filled 2017-09-11: qty 50

## 2017-09-11 MED ORDER — AMLODIPINE BESYLATE 5 MG PO TABS
5.0000 mg | ORAL_TABLET | Freq: Every day | ORAL | Status: DC
  Administered 2017-09-11: 5 mg via ORAL
  Filled 2017-09-11: qty 1

## 2017-09-11 MED ORDER — METHYLPREDNISOLONE SODIUM SUCC 125 MG IJ SOLR
125.0000 mg | Freq: Every day | INTRAMUSCULAR | Status: DC
  Administered 2017-09-11: 125 mg via INTRAVENOUS

## 2017-09-11 MED ORDER — SODIUM CHLORIDE 0.9 % IV SOLN
Freq: Once | INTRAVENOUS | Status: AC
  Administered 2017-09-11: 09:00:00 via INTRAVENOUS

## 2017-09-11 MED ORDER — ONDANSETRON 4 MG PO TBDP: 4.0000 mg | ORAL_TABLET | Freq: Three times a day (TID) | ORAL | 0 refills | Status: DC | PRN

## 2017-09-11 MED ORDER — DIPHENHYDRAMINE HCL 25 MG PO CAPS
50.0000 mg | ORAL_CAPSULE | Freq: Once | ORAL | Status: AC
  Administered 2017-09-11: 50 mg via ORAL

## 2017-09-11 MED ORDER — AMLODIPINE BESYLATE 5 MG PO TABS: 5.0000 mg | ORAL_TABLET | Freq: Every day | ORAL | 0 refills | Status: DC

## 2017-09-11 NOTE — Patient Instructions (Addendum)
[facility] Discharge Instructions for Patients Receiving Chemotherapy  Today you received the following chemotherapy agents Rituxan.  To help prevent nausea and vomiting after your treatment, we encourage you to take your nausea medication as prescribed.  If you develop nausea and vomiting that is not controlled by your nausea medication, call the clinic.   BELOW ARE SYMPTOMS THAT SHOULD BE REPORTED IMMEDIATELY:  *FEVER GREATER THAN 100.5 F  *CHILLS WITH OR WITHOUT FEVER  NAUSEA AND VOMITING THAT IS NOT CONTROLLED WITH YOUR NAUSEA MEDICATION  *UNUSUAL SHORTNESS OF BREATH  *UNUSUAL BRUISING OR BLEEDING  TENDERNESS IN MOUTH AND THROAT WITH OR WITHOUT PRESENCE OF ULCERS  *URINARY PROBLEMS  *BOWEL PROBLEMS  UNUSUAL RASH Items with * indicate a potential emergency and should be followed up as soon as possible.  Feel free to call the clinic should you have any questions or concerns. The clinic phone number is [phone].  Please show the [facility] at check-in to the Emergency Department and triage nurse.  Rituximab injection What is this medicine? RITUXIMAB (ri TUX i mab) is a monoclonal antibody. It is used to treat certain types of cancer like non-Hodgkin lymphoma and chronic lymphocytic leukemia. It is also used to treat rheumatoid arthritis, granulomatosis with polyangiitis (or Wegener's granulomatosis), and microscopic polyangiitis. This medicine may be used for other purposes; ask your health care provider or pharmacist if you have questions. COMMON BRAND NAME(S): Rituxan What should I tell my health care provider before I take this medicine? They need to know if you have any of these conditions: -heart disease -infection (especially a virus infection such as hepatitis B, chickenpox, cold sores, or herpes) -immune system problems -irregular heartbeat -kidney disease -lung or breathing disease, like asthma -recently received or scheduled to  receive a vaccine -an unusual or allergic reaction to rituximab, mouse proteins, other medicines, foods, dyes, or preservatives -pregnant or trying to get pregnant -breast-feeding How should I use this medicine? This medicine is for infusion into a vein. It is administered in a hospital or clinic by a specially trained health care professional. A special MedGuide will be given to you by the pharmacist with each prescription and refill. Be sure to read this information carefully each time. Talk to your pediatrician regarding the use of this medicine in children. This medicine is not approved for use in children. Overdosage: If you think you have taken too much of this medicine contact a poison control center or emergency room at once. NOTE: This medicine is only for you. Do not share this medicine with others. What if I miss a dose? It is important not to miss a dose. Call your doctor or health care professional if you are unable to keep an appointment. What may interact with this medicine? -cisplatin -other medicines for arthritis like disease modifying antirheumatic drugs or tumor necrosis factor inhibitors -live virus vaccines This list may not describe all possible interactions. Give your health care provider a list of all the medicines, herbs, non-prescription drugs, or dietary supplements you use. Also tell them if you smoke, drink alcohol, or use illegal drugs. Some items may interact with your medicine. What should I watch for while using this medicine? Your condition will be monitored carefully while you are receiving this medicine. You may need blood work done while you are taking this medicine. This medicine can cause serious allergic reactions. To reduce your risk you may need to take medicine before treatment with this medicine. Take your medicine as  directed. In some patients, this medicine may cause a serious brain infection that may cause death. If you have any problems seeing,  thinking, speaking, walking, or standing, tell your doctor right away. If you cannot reach your doctor, urgently seek other source of medical care. Call your doctor or health care professional for advice if you get a fever, chills or sore throat, or other symptoms of a cold or flu. Do not treat yourself. This drug decreases your body's ability to fight infections. Try to avoid being around people who are sick. Do not become pregnant while taking this medicine or for 12 months after stopping it. Women should inform their doctor if they wish to become pregnant or think they might be pregnant. There is a potential for serious side effects to an unborn child. Talk to your health care professional or pharmacist for more information. What side effects may I notice from receiving this medicine? Side effects that you should report to your doctor or health care professional as soon as possible: -breathing problems -chest pain -dizziness or feeling faint -fast, irregular heartbeat -low blood counts - this medicine may decrease the number of white blood cells, red blood cells and platelets. You may be at increased risk for infections and bleeding. -mouth sores -redness, blistering, peeling or loosening of the skin, including inside the mouth (this can be added for any serious or exfoliative rash that could lead to hospitalization) -signs of infection - fever or chills, cough, sore throat, pain or difficulty passing urine -signs and symptoms of kidney injury like trouble passing urine or change in the amount of urine -signs and symptoms of liver injury like dark yellow or brown urine; general ill feeling or flu-like symptoms; light-colored stools; loss of appetite; nausea; right upper belly pain; unusually weak or tired; yellowing of the eyes or skin -stomach pain -vomiting Side effects that usually do not require medical attention (report to your doctor or health care professional if they continue or are  bothersome): -headache -joint pain -muscle cramps or muscle pain This list may not describe all possible side effects. Call your doctor for medical advice about side effects. You may report side effects to FDA at 1-800-FDA-1088. Where should I keep my medicine? This drug is given in a hospital or clinic and will not be stored at home. NOTE: This sheet is a summary. It may not cover all possible information. If you have questions about this medicine, talk to your doctor, pharmacist, or health care provider.  2018 Elsevier/Gold Standard (2016-03-22 15:28:09)  Rituximab injection What is this medicine? RITUXIMAB (ri TUX i mab) is a monoclonal antibody. It is used to treat certain types of cancer like non-Hodgkin lymphoma and chronic lymphocytic leukemia. It is also used to treat rheumatoid arthritis, granulomatosis with polyangiitis (or Wegener's granulomatosis), and microscopic polyangiitis. This medicine may be used for other purposes; ask your health care provider or pharmacist if you have questions. COMMON BRAND NAME(S): Rituxan What should I tell my health care provider before I take this medicine? They need to know if you have any of these conditions: -heart disease -infection (especially a virus infection such as hepatitis B, chickenpox, cold sores, or herpes) -immune system problems -irregular heartbeat -kidney disease -lung or breathing disease, like asthma -recently received or scheduled to receive a vaccine -an unusual or allergic reaction to rituximab, mouse proteins, other medicines, foods, dyes, or preservatives -pregnant or trying to get pregnant -breast-feeding How should I use this medicine? This medicine is for infusion  into a vein. It is administered in a hospital or clinic by a specially trained health care professional. A special MedGuide will be given to you by the pharmacist with each prescription and refill. Be sure to read this information carefully each  time. Talk to your pediatrician regarding the use of this medicine in children. This medicine is not approved for use in children. Overdosage: If you think you have taken too much of this medicine contact a poison control center or emergency room at once. NOTE: This medicine is only for you. Do not share this medicine with others. What if I miss a dose? It is important not to miss a dose. Call your doctor or health care professional if you are unable to keep an appointment. What may interact with this medicine? -cisplatin -other medicines for arthritis like disease modifying antirheumatic drugs or tumor necrosis factor inhibitors -live virus vaccines This list may not describe all possible interactions. Give your health care provider a list of all the medicines, herbs, non-prescription drugs, or dietary supplements you use. Also tell them if you smoke, drink alcohol, or use illegal drugs. Some items may interact with your medicine. What should I watch for while using this medicine? Your condition will be monitored carefully while you are receiving this medicine. You may need blood work done while you are taking this medicine. This medicine can cause serious allergic reactions. To reduce your risk you may need to take medicine before treatment with this medicine. Take your medicine as directed. In some patients, this medicine may cause a serious brain infection that may cause death. If you have any problems seeing, thinking, speaking, walking, or standing, tell your doctor right away. If you cannot reach your doctor, urgently seek other source of medical care. Call your doctor or health care professional for advice if you get a fever, chills or sore throat, or other symptoms of a cold or flu. Do not treat yourself. This drug decreases your body's ability to fight infections. Try to avoid being around people who are sick. Do not become pregnant while taking this medicine or for 12 months after stopping  it. Women should inform their doctor if they wish to become pregnant or think they might be pregnant. There is a potential for serious side effects to an unborn child. Talk to your health care professional or pharmacist for more information. What side effects may I notice from receiving this medicine? Side effects that you should report to your doctor or health care professional as soon as possible: -breathing problems -chest pain -dizziness or feeling faint -fast, irregular heartbeat -low blood counts - this medicine may decrease the number of white blood cells, red blood cells and platelets. You may be at increased risk for infections and bleeding. -mouth sores -redness, blistering, peeling or loosening of the skin, including inside the mouth (this can be added for any serious or exfoliative rash that could lead to hospitalization) -signs of infection - fever or chills, cough, sore throat, pain or difficulty passing urine -signs and symptoms of kidney injury like trouble passing urine or change in the amount of urine -signs and symptoms of liver injury like dark yellow or brown urine; general ill feeling or flu-like symptoms; light-colored stools; loss of appetite; nausea; right upper belly pain; unusually weak or tired; yellowing of the eyes or skin -stomach pain -vomiting Side effects that usually do not require medical attention (report to your doctor or health care professional if they continue or are bothersome): -  headache -joint pain -muscle cramps or muscle pain This list may not describe all possible side effects. Call your doctor for medical advice about side effects. You may report side effects to FDA at 1-800-FDA-1088. Where should I keep my medicine? This drug is given in a hospital or clinic and will not be stored at home. NOTE: This sheet is a summary. It may not cover all possible information. If you have questions about this medicine, talk to your doctor, pharmacist, or health  care provider.  2018 Elsevier/Gold Standard (2016-03-22 15:28:09)   Raynaud Phenomenon Raynaud phenomenon is a condition that affects the blood vessels (arteries) that carry blood to your fingers and toes. The arteries that supply blood to your ears or the tip of your nose might also be affected. Raynaud phenomenon causes the arteries to temporarily narrow. As a result, the flow of blood to the affected areas is temporarily decreased. This usually occurs in response to cold temperatures or stress. During an attack, the skin in the affected areas turns white. You may also feel tingling or numbness in those areas. Attacks usually last for only a brief period, and then the blood flow to the area returns to normal. In most cases, Raynaud phenomenon does not cause serious health problems. What are the causes? For many people with this condition, the cause is not known. Raynaud phenomenon is sometimes associated with other diseases, such as scleroderma or lupus. What increases the risk? Raynaud phenomenon can affect anyone, but it develops most often in people who are 6-24 years old. It affects more females than males. What are the signs or symptoms? Symptoms of Raynaud phenomenon may occur when you are exposed to cold temperatures or when you have emotional stress. The symptoms may last for a few minutes or up to several hours. They usually affect your fingers but may also affect your toes, ears, or the tip of your nose. Symptoms may include:  Changes in skin color. The skin in the affected areas will turn pale or white. The skin may then change from white to bluish to red as normal blood flow returns to the area.  Numbness, tingling, or pain in the affected areas.  In severe cases, sores may develop in the affected areas. How is this diagnosed? Your health care provider will do a physical exam and take your medical history. You may be asked to put your hands in cold water to check for a reaction to  cold temperature. Blood tests may be done to check for other diseases or conditions. Your health care provider may also order a test to check the movement of blood through your arteries and veins (vascular ultrasound). How is this treated? Treatment often involves making lifestyle changes and taking steps to control your exposure to cold temperatures. For more severe cases, medicine (calcium channel blockers) may be used to improve blood flow. Surgery is sometimes done to block the nerves that control the affected arteries, but this is rare. Follow these instructions at home:  Avoid exposure to cold by taking these steps: ? If possible, stay indoors during cold weather. ? When you go outside during cold weather, dress in layers and wear mittens, a hat, a scarf, and warm footwear. ? Wear mittens or gloves when handling ice or frozen food. ? Use holders for glasses or cans containing cold drinks. ? Let warm water run for a while before taking a shower or bath. ? Warm up the car before driving in cold weather.  If  possible, avoid stressful and emotional situations. Exercise, meditation, and yoga may help you cope with stress. Biofeedback may be useful.  Do not use any tobacco products, including cigarettes, chewing tobacco, or electronic cigarettes. If you need help quitting, ask your health care provider.  Avoid secondhand smoke.  Limit your use of caffeine. Switch to decaffeinated coffee, tea, and soda. Avoid chocolate.  Wear loose fitting socks and comfortable, roomy shoes.  Avoid vibrating tools and machinery.  Take medicines only as directed by your health care provider. Contact a health care provider if:  Your discomfort becomes worse despite lifestyle changes.  You develop sores on your fingers or toes that do not heal.  Your fingers or toes turn black.  You have breaks in the skin on your fingers or toes.  You have a fever.  You have pain or swelling in your joints.  You  have a rash.  Your symptoms occur on only one side of your body. This information is not intended to replace advice given to you by your health care provider. Make sure you discuss any questions you have with your health care provider. Document Released: 08/11/2000 Document Revised: 01/20/2016 Document Reviewed: 02/16/2016 Elsevier Interactive Patient Education  2017 [employer]    Amlodipine tablets What is this medicine? AMLODIPINE (am LOE di peen) is a calcium-channel blocker. It affects the amount of calcium found in your heart and muscle cells. This relaxes your blood vessels, which can reduce the amount of work the heart has to do. This medicine is used to lower high blood pressure. It is also used to prevent chest pain. This medicine may be used for other purposes; ask your health care provider or pharmacist if you have questions. COMMON BRAND NAME(S): Norvasc What should I tell my health care provider before I take this medicine? They need to know if you have any of these conditions: -heart problems like heart failure or aortic stenosis -liver disease -an unusual or allergic reaction to amlodipine, other medicines, foods, dyes, or preservatives -pregnant or trying to get pregnant -breast-feeding How should I use this medicine? Take this medicine by mouth with a glass of water. Follow the directions on the prescription label. Take your medicine at regular intervals. Do not take more medicine than directed. Talk to your pediatrician regarding the use of this medicine in children. Special care may be needed. This medicine has been used in children as young as 6. Persons over 21 years old may have a stronger reaction to this medicine and need smaller doses. Overdosage: If you think you have taken too much of this medicine contact a poison control center or emergency room at once. NOTE: This medicine is only for you. Do not share this medicine with others. What if I miss a dose? If  you miss a dose, take it as soon as you can. If it is almost time for your next dose, take only that dose. Do not take double or extra doses. What may interact with this medicine? -herbal or dietary supplements -local or general anesthetics -medicines for high blood pressure -medicines for prostate problems -rifampin This list may not describe all possible interactions. Give your health care provider a list of all the medicines, herbs, non-prescription drugs, or dietary supplements you use. Also tell them if you smoke, drink alcohol, or use illegal drugs. Some items may interact with your medicine. What should I watch for while using this medicine? Visit your doctor or health care professional for regular check ups.  Check your blood pressure and pulse rate regularly. Ask your health care professional what your blood pressure and pulse rate should be, and when you should contact him or her. This medicine may make you feel confused, dizzy or lightheaded. Do not drive, use machinery, or do anything that needs mental alertness until you know how this medicine affects you. To reduce the risk of dizzy or fainting spells, do not sit or stand up quickly, especially if you are an older patient. Avoid alcoholic drinks; they can make you more dizzy. Do not suddenly stop taking amlodipine. Ask your doctor or health care professional how you can gradually reduce the dose. What side effects may I notice from receiving this medicine? Side effects that you should report to your doctor or health care professional as soon as possible: -allergic reactions like skin rash, itching or hives, swelling of the face, lips, or tongue -breathing problems -changes in vision or hearing -chest pain -fast, irregular heartbeat -swelling of legs or ankles Side effects that usually do not require medical attention (report to your doctor or health care professional if they continue or are bothersome): -dry mouth -facial  flushing -nausea, vomiting -stomach gas, pain -tired, weak -trouble sleeping This list may not describe all possible side effects. Call your doctor for medical advice about side effects. You may report side effects to FDA at 1-800-FDA-1088. Where should I keep my medicine? Keep out of the reach of children. Store at room temperature between 59 and 86 degrees F (15 and 30 degrees C). Protect from light. Keep container tightly closed. Throw away any unused medicine after the expiration date. NOTE: This sheet is a summary. It may not cover all possible information. If you have questions about this medicine, talk to your doctor, pharmacist, or health care provider.  2018 Elsevier/Gold Standard (2012-07-12 11:40:58)

## 2017-09-11 NOTE — Progress Notes (Signed)
Sympt[facility] Management Clinic Progress Note   ZEUS MARQUIS [id number] [birth year] 67 y.o.  DARIEN KADING is managed by Dr. Sullivan Lone  Actively treated with chemotherapy: yes  Current Therapy: Rituximab   Last Treated: 09/11/2017  Assessment: Plan:    Raynaud's disease without gangrene  DESHANNON HINCHLIFFE was seen in the infusion room for a suspected chemotherapy reaction. He was receiving rituximab at the time of his reaction. His symptoms included: Chills and pallor of the distal fingers. He was premedicated with Benadryl and Solu-Medrol prior to starting chemotherapy. Rituximab was paused and Junius Finner.  After a discussion with Mr. Mcneill it was determined that he had had similar episodes of pallor of his fingertips when exposed to cold.  He denies any diagnosis of Raynaud's or connective tissue disorders.  Mr. Albaugh was given amlodipine 5 mg p.o. x1 KAYLE CORREA did  respond to intervention.  The pallor in the patient's distal fingers resolved.  Mr. Gauss was additionally given a prescription for amlodipine 5 mg once daily with 30 dispensed and no refills.  He was instructed to follow-up with Dr. Kathryne Eriksson who is his primary care provider regarding this diagnosis.  Please see After Visit Summary for patient specific instructions.  Future Appointments  Date Time Provider [place]  09/18/2017  8:30 AM [facility] LAB 4 [facility] None  09/18/2017  9:00 AM Brunetta Genera, MD [facility] None  09/18/2017 10:00 AM [facility] C8 [facility] None  09/25/2017  9:45 AM [facility] LAB 3 [facility] None  09/25/2017 10:45 AM [facility] E16 [facility] None  10/02/2017  9:45 AM [facility] LAB 3 [facility] None  10/02/2017 10:45 AM [facility] D11 [facility] None    No orders of the defined types were placed in this encounter.      Subjective:   Patient ID:  CRISTINA MATTERN is a 67 y.o. (DOB 1950/12/06) male.  Chief Complaint: No chief complaint on  file.   HPI WOOD NOVACEK was seen in the infusion room for a suspected reaction to chemotherapy. VERTIS SCHEIB was seen in the infusion room for a suspected chemotherapy reaction. He was receiving rituximab at the time of his reaction. His symptoms included: Chills and pallor of the distal fingers. He was premedicated with Benadryl and Solu-Medrol prior to starting chemotherapy. Rituximab was paused and Junius Finner.  After a discussion with Mr. Rigel it was determined that he had had similar episodes of pallor of his fingertips when exposed to cold.  He denies any diagnosis of Raynaud's or connective tissue disorders. Mr. Ferreri was given amlodipine 5 mg p.o. Cory Munch did  respond to intervention.  The pallor in the patient's distal fingers resolved.  Mr. Dunton was additionally given a prescription for amlodipine 5 mg once daily with 30 dispensed and no refills.  He was instructed to follow-up with Dr. Kathryne Eriksson who is his primary care provider regarding this diagnosis.  Medications: I have reviewed the patient's current medications.  Allergies:  Allergies  Allergen Reactions  . Penicillins Hives    Past Medical History:  Diagnosis Date  . Sinus complaint   . Skin cancer     Past Surgical History:  Procedure Laterality Date  . basal cell carcinoma surgery    . CATARACT EXTRACTION      No family history on file.  Social History   Socioeconomic History  . Marital status: Single    Spouse name: Not on file  . Number of children:  Not on file  . Years of education: Not on file  . Highest education level: Not on file  Social Needs  . Financial resource strain: Not on file  . Food insecurity - worry: Not on file  . Food insecurity - inability: Not on file  . Transportation needs - medical: Not on file  . Transportation needs - non-medical: Not on file  Occupational History  . Not on file  Tobacco Use  . Smoking status: Never Smoker  . Smokeless tobacco: Never Used   Substance and Sexual Activity  . Alcohol use: Yes    Comment: occasional  . Drug use: No  . Sexual activity: Not on file  Other Topics Concern  . Not on file  Social History Narrative  . Not on file    Past Medical History, Surgical history, Social history, and Family history were reviewed and updated as appropriate.   Please see review of systems for further details on the patient's review from today.   Review of Systems:  Review of Systems  Constitutional: Negative for chills, diaphoresis and fever.  HENT: Negative for trouble swallowing.   Respiratory: Negative for cough, choking, chest tightness, shortness of breath, wheezing and stridor.   Cardiovascular: Negative for chest pain and leg swelling.  Skin: Positive for pallor (Pallor of the distal fingers bilaterally.).    Objective:   Physical Exam:  There were no vitals taken for this visit.   Physical Exam  Constitutional: No distress.  HENT:  Head: Normocephalic and atraumatic.  Cardiovascular: Normal rate, regular rhythm and normal heart sounds. Exam reveals no gallop and no friction rub.  No murmur heard. Pulmonary/Chest: Effort normal and breath sounds normal. No respiratory distress. He has no wheezes. He has no rales.  Skin: He is not diaphoretic. There is pallor (Pallor of the distal fingers bilaterally.).  No pain to palpation of the fingertips bilaterally.       Lab Review:     Component Value Date/Time   NA 139 09/11/2017 0751   NA 136 08/31/2017 1101   K 4.0 09/11/2017 0751   K 4.2 08/31/2017 1101   CL 107 09/11/2017 0751   CO2 24 09/11/2017 0751   CO2 28 08/31/2017 1101   GLUCOSE 135 09/11/2017 0751   GLUCOSE 127 08/31/2017 1101   BUN 22 09/11/2017 0751   BUN 24.1 08/31/2017 1101   CREATININE 0.83 09/11/2017 0751   CREATININE 0.9 08/31/2017 1101   CALCIUM 9.2 09/11/2017 0751   CALCIUM 9.2 08/31/2017 1101   PROT 7.7 09/11/2017 0751   PROT 7.8 08/31/2017 1101   ALBUMIN 3.4 (L) 09/11/2017  0751   ALBUMIN 3.6 08/31/2017 1101   AST 22 09/11/2017 0751   AST 22 08/31/2017 1101   ALT 21 09/11/2017 0751   ALT 20 08/31/2017 1101   ALKPHOS 84 09/11/2017 0751   ALKPHOS 78 08/31/2017 1101   BILITOT 0.4 09/11/2017 0751   BILITOT 0.46 08/31/2017 1101   GFRNONAA >60 09/11/2017 0751   GFRAA >60 09/11/2017 0751       Component Value Date/Time   WBC 16.6 (H) 09/11/2017 0751   RBC 3.59 (L) 09/11/2017 0751   RBC 3.59 (L) 09/11/2017 0751   HGB 10.8 (L) 09/11/2017 0751   HGB 11.0 (L) 08/31/2017 1101   HCT 33.2 (L) 09/11/2017 0751   HCT 33.8 (L) 08/31/2017 1101   PLT 99 (L) 09/11/2017 0751   PLT 84 (L) 08/31/2017 1101   MCV 92.5 09/11/2017 0751   MCV 91.1 08/31/2017 1101  MCH 30.1 09/11/2017 0751   MCHC 32.5 09/11/2017 0751   RDW 17.2 (H) 09/11/2017 0751   RDW 17.3 (H) 08/31/2017 1101   LYMPHSABS 12.0 (H) 09/11/2017 0751   LYMPHSABS 11.4 (H) 08/31/2017 1101   MONOABS 0.7 09/11/2017 0751   MONOABS 0.7 08/31/2017 1101   EOSABS 0.1 09/11/2017 0751   EOSABS 0.1 08/31/2017 1101   BASOSABS 0.0 09/11/2017 0751   BASOSABS 0.0 08/31/2017 1101   -------------------------------  Imaging from last 24 hours (if applicable):  Radiology interpretation: No results found.      This patient was seen with Dr. Irene Limbo with my treatment plan reviewed with him. He expressed agreement with my medical management of this patient.   ADDENDUM  Patient was personal interviewed , examination and relevant results reviewed. His assessment and plan were discussed in details with Sandi Mealy PA-C. He appears to have Raynaud's syndrome precipitated by the cold weather, IVF at [facility] and being on Ritalin. Responded appropriately to warming his hands and Amlodipine. Discharged from infusion room on Amlodipine. No evidence of overt reaction to Rituxan and was able to complete the Rituxan without any further issues.  Sullivan Lone MD MS

## 2017-09-11 NOTE — Progress Notes (Signed)
11:05 - Rituxan running at 150 mg/hr (70mL/hr) and pt stated he felt chilled, this RN noticed that pts fingertips were white and cold. Rituxan paused, NS wide open, BP 162/95, HR 76, O2 99 RA.   11:07- Sandi Mealy, PA notified and assessed pt and Dr. Irene Limbo notified.  Dr. Irene Limbo ordered for pt to be administered 5 mg P.O Amlodipine.   11:28- BP 157/83, waiting for Amlodipine to come from pharmacy and verbal orders received from Sandi Mealy to restart Rituxan at previous rate of 150 mg/hr (16mL/hr for 35 mLs)  1140- 5 mg PO Amlodipine administered to pt  Pt finished infusion with no other complications.  Pt did not make it to max rate of 400 mg/hr (178mL/hr).  Pt only made it to 350 mg/hr ()144mL/Hr) before the infusion was complete.

## 2017-09-12 LAB — HEPATITIS B SURFACE ANTIBODY,QUALITATIVE: Hep B S Ab: NONREACTIVE

## 2017-09-12 LAB — HEPATITIS B SURFACE ANTIGEN: Hepatitis B Surface Ag: NEGATIVE

## 2017-09-17 NOTE — Progress Notes (Signed)
HEMATOLOGY/ONCOLOGY CLINIC NOTE  Date of Service: 09/18/17  Patient Care Team: Christain Sacramento, MD as PCP - General (Family Medicine)  CHIEF COMPLAINTS/PURPOSE OF CONSULTATION:  F/u for SMZL  HISTORY OF PRESENTING ILLNESS:  Jared Nixon is a wonderful 68 y.o. male who has been referred to [place] by Dr Redmond Pulling, [name], MD for evaluation and management of new splenomegaly with lymphocytosis.  Initially, the patient presented into his PCP's office on 06/11/17 complaining of a small lump under his left lower ribcage which had been present for several weeks. This was palpable, per his PCP's notes and Dr Redmond Pulling subsequently ordered a CT A/P to evaluate this further. This was performed on 06/13/17 which confirmed the presence of splenomegaly and portal caval lymphadenopathy which was concerning for lymphoproliferative disease. He also had lab work (summarized as below) which also showed some gradual leukocytosis/lymphocytosis, with worsening anemia and thrombocytopenia. He denies any pain over the area of his splenomegaly, difficulty eating or premature satiation.   In a general sense, he had not had an significant chronic health issues which he is being treated for. He does struggle with some tinnitus involving only the left ear which began around the end of July and has somewhat worsened since. One year ago he also had bilateral inguinal hernia surgery and a basal cell carcinoma excision. He also notes in the 80s that he had a gynecomastia removed, but otherwise of recently he has been feeling at his baseline state-of-health. He was initially informed that he suffered from leukocytosis earlier this year by Dr Dois Davenport office.   He did smoke for a brief time several decades ago in his 91s (0.5ppd), but none since. He does drink alcohol, and on average he drinks 7-8 beers a week. He denies any chemical/radiaiton exposures or exposure during his career. He does not partake in illicit drug usage. He has  never previously needed a blood transfusion in the past.   He is currently prescribed Clonazepam and Ritalin to use on a daily basis which are both managed by Christain Sacramento, MD. He takes these for dealing with clutter/hoarding issues ongoing within his personal life.   Of note, the patient reports that his father did have bladder cancer. His paternal Aunts all were diagnosed with cancer, but he is unsure of which types. He does have a h/o CVA on his mothers side, but nothing else of note.   On ROS, he does report a 10lbs weight loss which was observed by Dr Redmond Pulling and was attributed to his usage of Ritalin. He also notes that he does suffer from sleeping issues. He does experience fatigue intermittently, however, this is not debilitating or hindering his daily activities. He denies fever, chills, night sweats, abdominal pain, nausea, vomiting, diarrhea, rash, or any other associated symptoms.   INTERVAL HISTORY  Patient is here for f/u of his newly diagnosed SMZL presenting with lymphocytosis and mild asymptomatic anemia and thrombocytopenia.. He has been overall well in the interim.  Of note since the patient last visit, pt was seen by Sandi Mealy, PA-C in infusion on 09/11/17 for Raynaud's disease, and was prescribed amlodipine 5mg  q day, but ended up only taking it that day since he believed the amlodipine to be for his BP. Pt has been tolerating the Rituxan well.  Recent Lab results: Today (09/18/17) of CBC,  CMP, and Reticulocytes is as follows: all values are WNL except for RBC at 3.84, Hgb at 11.4, HCT at 34.9, RDW at 17.0,  Platelets at 94k, Lymph Abs at 5.5, Retic Ct Pct at 2.6, Retic Count Abs at 99.8, and albumin at 3.4.   No other acute prohibitive toxicities from Rituxan.  On review of systems, pt reports occasional abdominal pains, and a little flatulence but denies leg swelling.    MEDICAL HISTORY:  Past Medical History:  Diagnosis Date  . Sinus complaint   . Skin cancer      SURGICAL HISTORY: Past Surgical History:  Procedure Laterality Date  . basal cell carcinoma surgery    . CATARACT EXTRACTION      SOCIAL HISTORY: Social History   Socioeconomic History  . Marital status: Single    Spouse name: Not on file  . Number of children: Not on file  . Years of education: Not on file  . Highest education level: Not on file  Social Needs  . Financial resource strain: Not on file  . Food insecurity - worry: Not on file  . Food insecurity - inability: Not on file  . Transportation needs - medical: Not on file  . Transportation needs - non-medical: Not on file  Occupational History  . Not on file  Tobacco Use  . Smoking status: Never Smoker  . Smokeless tobacco: Never Used  Substance and Sexual Activity  . Alcohol use: Yes    Comment: occasional  . Drug use: No  . Sexual activity: Not on file  Other Topics Concern  . Not on file  Social History Narrative  . Not on file   He is originally from [place], [place].   FAMILY HISTORY: No family history on file.  ALLERGIES:  is allergic to penicillins.  MEDICATIONS:  Current Outpatient Medications  Medication Sig Dispense Refill  . ClonazePAM (KLONOPIN PO) Take 1 mg 3 (three) times daily by mouth.     . fluticasone (FLONASE) 50 MCG/ACT nasal spray Place 1 spray into both nostrils daily.    . methylphenidate (RITALIN) 10 MG tablet Take 10 mg 3 (three) times daily by mouth.     . ondansetron (ZOFRAN ODT) 4 MG disintegrating tablet Take 1 tablet (4 mg total) by mouth every 8 (eight) hours as needed for nausea or vomiting. 20 tablet 0  . amLODipine (NORVASC) 5 MG tablet Take 1 tablet (5 mg total) by mouth daily. (Patient not taking: Reported on 09/18/2017) 30 tablet 0   No current facility-administered medications for this visit.     REVIEW OF SYSTEMS:    A 10+ POINT REVIEW OF SYSTEMS WAS OBTAINED including neurology, dermatology, psychiatry, cardiac, respiratory, lymph, extremities, GI, GU,  Musculoskeletal, constitutional, breasts, reproductive, HEENT.  All pertinent positives are noted in the HPI.  All others are negative.  PHYSICAL EXAMINATION: ECOG PERFORMANCE STATUS: 1 - Symptomatic but completely ambulatory  . Vitals:   09/18/17 0849  BP: (!) 158/72  Pulse: 88  Resp: 20  Temp: 98.2 F (36.8 C)  SpO2: 100%   Filed Weights   09/18/17 0849  Weight: 167 lb 4.8 oz (75.9 kg)   .Body mass index is 23.33 kg/m.  GENERAL:alert, in no acute distress and comfortable SKIN: no acute rashes, no significant lesions EYES: conjunctiva are pink and non-injected, sclera anicteric OROPHARYNX: MMM, no exudates, no oropharyngeal erythema or ulceration NECK: supple, no JVD LYMPH:  no palpable lymphadenopathy in the cervical, axillary or inguinal regions LUNGS: clear to auscultation b/l with normal respiratory effort HEART: regular rate & rhythm ABDOMEN:  normoactive bowel sounds , non tender. Spleen is 5 finger breadths below the  left costal margin and the mid-clavicular line.  Extremity: no pedal edema PSYCH: alert & oriented x 3 with fluent speech NEURO: no focal motor/sensory deficits  LABORATORY DATA:  I have reviewed the data as listed  . CBC Latest Ref Rng & Units 09/18/2017 09/11/2017 08/31/2017  WBC 4.0 - 10.3 K/uL 10.1 16.6(H) 16.1(H)  Hemoglobin 13.0 - 17.1 g/dL - 10.8(L) 11.0(L)  Hematocrit 38.4 - 49.9 % 34.9(L) 33.2(L) 33.8(L)  Platelets 140 - 400 K/uL 94(L) 99(L) 84(L)   . CBC    Component Value Date/Time   WBC 10.1 09/18/2017 0826   WBC 16.6 (H) 09/11/2017 0751   RBC 3.84 (L) 09/18/2017 0826   RBC 3.84 (L) 09/18/2017 0826   HGB 10.8 (L) 09/11/2017 0751   HGB 11.0 (L) 08/31/2017 1101   HCT 34.9 (L) 09/18/2017 0826   HCT 33.8 (L) 08/31/2017 1101   PLT 94 (L) 09/18/2017 0826   PLT 84 (L) 08/31/2017 1101   MCV 90.9 09/18/2017 0826   MCV 91.1 08/31/2017 1101   MCH 29.7 09/18/2017 0826   MCHC 32.7 09/18/2017 0826   RDW 17.0 (H) 09/18/2017 0826   RDW 17.3  (H) 08/31/2017 1101   LYMPHSABS 5.5 (H) 09/18/2017 0826   LYMPHSABS 11.4 (H) 08/31/2017 1101   MONOABS 0.4 09/18/2017 0826   MONOABS 0.7 08/31/2017 1101   EOSABS 0.1 09/18/2017 0826   EOSABS 0.1 08/31/2017 1101   BASOSABS 0.0 09/18/2017 0826   BASOSABS 0.0 08/31/2017 1101    . CMP Latest Ref Rng & Units 09/18/2017 09/11/2017 08/31/2017  Glucose 70 - 140 mg/dL 132 135 127  BUN 7 - 26 mg/dL 20 22 24.1  Creatinine 0.70 - 1.30 mg/dL 0.83 0.83 0.9  Sodium 136 - 145 mmol/L 138 139 136  Potassium 3.5 - 5.1 mmol/L 3.9 4.0 4.2  Chloride 98 - 109 mmol/L 105 107 -  CO2 22 - 29 mmol/L 25 24 28   Calcium 8.4 - 10.4 mg/dL 9.2 9.2 9.2  Total Protein 6.4 - 8.3 g/dL 7.5 7.7 7.8  Total Bilirubin 0.2 - 1.2 mg/dL 0.3 0.4 0.46  Alkaline Phos 40 - 150 U/L 80 84 78  AST 5 - 34 U/L 24 22 22   ALT 0 - 55 U/L 27 21 20      -flow cytometry consistent with marginal zone lymphoma vs splenic Lymphoma      RADIOGRAPHIC STUDIES: I have personally reviewed the radiological images as listed and agreed with the findings in the report. No results found.  ASSESSMENT & PLAN:   AVEN CHRISTEN is a  67 y.o. 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 -Lymphocyte counts are starting to improve -Pt is tolerating Rituxan well without any prohibitive toxicities -Informed pt on the process of the treatment and its goals  Plan: Continue remaining 3 weekly doses of rituxan as scheduled [facility] with Dr Irene Limbo in 2 weeks with 4th weekly dose of rituxan Weekly labs while on rituxan for toxicity check  #2 Elevated BP #3 Raynaud's  Syndrome Plan -Encouraged pt to continue amlodipine 5mg  po daily to treat both his Raynaud's and high BP.  -Encouraged pt to check BP with an arm cuff at home and f/u for BPmx with PCP  All of the  patients questions were answered with apparent satisfaction. The patient knows to call the clinic with any problems, questions or concerns. Total  time spent on direct patient contact 15 mins . Total time spent on clinical encounter 20 mins.   Sullivan Lone MD [place] AAHIVMS [facility] Hematology/Oncology Physician [facility]  (Office):       [phone] (Work cell):  [phone] (Fax):           [phone]  09/18/2017 9:29 AM   This document serves as a record of services personally performed by Sullivan Lone, MD. It was created on his behalf by Baldwin [place], a trained medical scribe. The creation of this record is based on the scribe's personal observations and the provider's statements to them.   .I have reviewed the above documentation for accuracy and completeness, and I agree with the above. Brunetta Genera MD MS

## 2017-09-18 ENCOUNTER — Inpatient Hospital Stay (HOSPITAL_BASED_OUTPATIENT_CLINIC_OR_DEPARTMENT_OTHER): Payer: Medicare Other | Admitting: Hematology

## 2017-09-18 ENCOUNTER — Encounter: Payer: Self-pay | Admitting: Hematology

## 2017-09-18 ENCOUNTER — Inpatient Hospital Stay: Payer: Medicare Other

## 2017-09-18 VITALS — BP 130/62 | HR 83 | Temp 98.3°F | Resp 18

## 2017-09-18 VITALS — BP 158/72 | HR 88 | Temp 98.2°F | Resp 20 | Ht 71.0 in | Wt 167.3 lb

## 2017-09-18 DIAGNOSIS — Z8052 Family history of malignant neoplasm of bladder: Secondary | ICD-10-CM | POA: Diagnosis not present

## 2017-09-18 DIAGNOSIS — I10 Essential (primary) hypertension: Secondary | ICD-10-CM | POA: Diagnosis not present

## 2017-09-18 DIAGNOSIS — K4020 Bilateral inguinal hernia, without obstruction or gangrene, not specified as recurrent: Secondary | ICD-10-CM | POA: Diagnosis not present

## 2017-09-18 DIAGNOSIS — Z85828 Personal history of other malignant neoplasm of skin: Secondary | ICD-10-CM | POA: Diagnosis not present

## 2017-09-18 DIAGNOSIS — C8307 Small cell B-cell lymphoma, spleen: Secondary | ICD-10-CM

## 2017-09-18 DIAGNOSIS — D72820 Lymphocytosis (symptomatic): Secondary | ICD-10-CM

## 2017-09-18 DIAGNOSIS — Z7189 Other specified counseling: Secondary | ICD-10-CM

## 2017-09-18 DIAGNOSIS — R161 Splenomegaly, not elsewhere classified: Secondary | ICD-10-CM | POA: Diagnosis not present

## 2017-09-18 DIAGNOSIS — I7300 Raynaud's syndrome without gangrene: Secondary | ICD-10-CM

## 2017-09-18 DIAGNOSIS — Z79899 Other long term (current) drug therapy: Secondary | ICD-10-CM

## 2017-09-18 DIAGNOSIS — D696 Thrombocytopenia, unspecified: Secondary | ICD-10-CM

## 2017-09-18 DIAGNOSIS — Z809 Family history of malignant neoplasm, unspecified: Secondary | ICD-10-CM

## 2017-09-18 DIAGNOSIS — Z5112 Encounter for antineoplastic immunotherapy: Secondary | ICD-10-CM

## 2017-09-18 DIAGNOSIS — C8580 Other specified types of non-Hodgkin lymphoma, unspecified site: Secondary | ICD-10-CM | POA: Diagnosis not present

## 2017-09-18 LAB — CBC WITH DIFFERENTIAL (CANCER CENTER ONLY)
BASOS PCT: 0 %
Basophils Absolute: 0 10*3/uL (ref 0.0–0.1)
EOS ABS: 0.1 10*3/uL (ref 0.0–0.5)
Eosinophils Relative: 1 %
HCT: 34.9 % — ABNORMAL LOW (ref 38.4–49.9)
HEMOGLOBIN: 11.4 g/dL — AB (ref 13.0–17.1)
LYMPHS ABS: 5.5 10*3/uL — AB (ref 0.9–3.3)
Lymphocytes Relative: 54 %
MCH: 29.7 pg (ref 27.2–33.4)
MCHC: 32.7 g/dL (ref 32.0–36.0)
MCV: 90.9 fL (ref 79.3–98.0)
MONO ABS: 0.4 10*3/uL (ref 0.1–0.9)
MONOS PCT: 4 %
Neutro Abs: 4.1 10*3/uL (ref 1.5–6.5)
Neutrophils Relative %: 41 %
Platelet Count: 94 10*3/uL — ABNORMAL LOW (ref 140–400)
RBC: 3.84 MIL/uL — ABNORMAL LOW (ref 4.20–5.82)
RDW: 17 % — AB (ref 11.0–15.6)
WBC Count: 10.1 10*3/uL (ref 4.0–10.3)

## 2017-09-18 LAB — COMPREHENSIVE METABOLIC PANEL
ALBUMIN: 3.4 g/dL — AB (ref 3.5–5.0)
ALK PHOS: 80 U/L (ref 40–150)
ALT: 27 U/L (ref 0–55)
AST: 24 U/L (ref 5–34)
Anion gap: 8 (ref 3–11)
BUN: 20 mg/dL (ref 7–26)
CALCIUM: 9.2 mg/dL (ref 8.4–10.4)
CO2: 25 mmol/L (ref 22–29)
CREATININE: 0.83 mg/dL (ref 0.70–1.30)
Chloride: 105 mmol/L (ref 98–109)
GFR calc Af Amer: >60 mL/min (ref >60)
GFR calc non Af Amer: >60 mL/min (ref >60)
GLUCOSE: 132 mg/dL (ref 70–140)
Potassium: 3.9 mmol/L (ref 3.5–5.1)
SODIUM: 138 mmol/L (ref 136–145)
TOTAL PROTEIN: 7.5 g/dL (ref 6.4–8.3)
Total Bilirubin: 0.3 mg/dL (ref 0.2–1.2)

## 2017-09-18 LAB — RETICULOCYTES
RBC.: 3.84 MIL/uL — ABNORMAL LOW (ref 4.20–5.82)
RETIC CT PCT: 2.6 % — AB (ref 0.8–1.8)
Retic Count, Absolute: 99.8 10*3/uL — ABNORMAL HIGH (ref 34.8–93.9)

## 2017-09-18 MED ORDER — ACETAMINOPHEN 325 MG PO TABS
650.0000 mg | ORAL_TABLET | Freq: Once | ORAL | Status: AC
  Administered 2017-09-18: 650 mg via ORAL

## 2017-09-18 MED ORDER — DIPHENHYDRAMINE HCL 25 MG PO CAPS
50.0000 mg | ORAL_CAPSULE | Freq: Once | ORAL | Status: AC
  Administered 2017-09-18: 50 mg via ORAL

## 2017-09-18 MED ORDER — METHYLPREDNISOLONE SODIUM SUCC 125 MG IJ SOLR
125.0000 mg | Freq: Every day | INTRAMUSCULAR | Status: DC
  Administered 2017-09-18: 125 mg via INTRAVENOUS

## 2017-09-18 MED ORDER — RITUXIMAB CHEMO INJECTION 500 MG/50ML
375.0000 mg/m2 | Freq: Once | INTRAVENOUS | Status: AC
  Administered 2017-09-18: 700 mg via INTRAVENOUS
  Filled 2017-09-18: qty 50

## 2017-09-18 MED ORDER — SODIUM CHLORIDE 0.9 % IV SOLN
Freq: Once | INTRAVENOUS | Status: AC
  Administered 2017-09-18: 10:00:00 via INTRAVENOUS

## 2017-09-18 MED ORDER — FAMOTIDINE IN NACL 20-0.9 MG/50ML-% IV SOLN
20.0000 mg | Freq: Once | INTRAVENOUS | Status: AC
  Administered 2017-09-18: 20 mg via INTRAVENOUS

## 2017-09-18 NOTE — Patient Instructions (Signed)
[facility] Discharge Instructions for Patients Receiving Chemotherapy  Today you received the following chemotherapy agents Rituxan.  To help prevent nausea and vomiting after your treatment, we encourage you to take your nausea medication as prescribed.  If you develop nausea and vomiting that is not controlled by your nausea medication, call the clinic.   BELOW ARE SYMPTOMS THAT SHOULD BE REPORTED IMMEDIATELY:  *FEVER GREATER THAN 100.5 F  *CHILLS WITH OR WITHOUT FEVER  NAUSEA AND VOMITING THAT IS NOT CONTROLLED WITH YOUR NAUSEA MEDICATION  *UNUSUAL SHORTNESS OF BREATH  *UNUSUAL BRUISING OR BLEEDING  TENDERNESS IN MOUTH AND THROAT WITH OR WITHOUT PRESENCE OF ULCERS  *URINARY PROBLEMS  *BOWEL PROBLEMS  UNUSUAL RASH Items with * indicate a potential emergency and should be followed up as soon as possible.  Feel free to call the clinic should you have any questions or concerns. The clinic phone number is [phone].  Please show the [place] at check-in to the Emergency Department and triage nurse.  Rituximab injection What is this medicine? RITUXIMAB (ri TUX i mab) is a monoclonal antibody. It is used to treat certain types of cancer like non-Hodgkin lymphoma and chronic lymphocytic leukemia. It is also used to treat rheumatoid arthritis, granulomatosis with polyangiitis (or Wegener's granulomatosis), and microscopic polyangiitis. This medicine may be used for other purposes; ask your health care provider or pharmacist if you have questions. COMMON BRAND NAME(S): Rituxan What should I tell my health care provider before I take this medicine? They need to know if you have any of these conditions: -heart disease -infection (especially a virus infection such as hepatitis B, chickenpox, cold sores, or herpes) -immune system problems -irregular heartbeat -kidney disease -lung or breathing disease, like asthma -recently received or scheduled to  receive a vaccine -an unusual or allergic reaction to rituximab, mouse proteins, other medicines, foods, dyes, or preservatives -pregnant or trying to get pregnant -breast-feeding How should I use this medicine? This medicine is for infusion into a vein. It is administered in a hospital or clinic by a specially trained health care professional. A special MedGuide will be given to you by the pharmacist with each prescription and refill. Be sure to read this information carefully each time. Talk to your pediatrician regarding the use of this medicine in children. This medicine is not approved for use in children. Overdosage: If you think you have taken too much of this medicine contact a poison control center or emergency room at once. NOTE: This medicine is only for you. Do not share this medicine with others. What if I miss a dose? It is important not to miss a dose. Call your doctor or health care professional if you are unable to keep an appointment. What may interact with this medicine? -cisplatin -other medicines for arthritis like disease modifying antirheumatic drugs or tumor necrosis factor inhibitors -live virus vaccines This list may not describe all possible interactions. Give your health care provider a list of all the medicines, herbs, non-prescription drugs, or dietary supplements you use. Also tell them if you smoke, drink alcohol, or use illegal drugs. Some items may interact with your medicine. What should I watch for while using this medicine? Your condition will be monitored carefully while you are receiving this medicine. You may need blood work done while you are taking this medicine. This medicine can cause serious allergic reactions. To reduce your risk you may need to take medicine before treatment with this medicine. Take your medicine as  directed. In some patients, this medicine may cause a serious brain infection that may cause death. If you have any problems seeing,  thinking, speaking, walking, or standing, tell your doctor right away. If you cannot reach your doctor, urgently seek other source of medical care. Call your doctor or health care professional for advice if you get a fever, chills or sore throat, or other symptoms of a cold or flu. Do not treat yourself. This drug decreases your body's ability to fight infections. Try to avoid being around people who are sick. Do not become pregnant while taking this medicine or for 12 months after stopping it. Women should inform their doctor if they wish to become pregnant or think they might be pregnant. There is a potential for serious side effects to an unborn child. Talk to your health care professional or pharmacist for more information. What side effects may I notice from receiving this medicine? Side effects that you should report to your doctor or health care professional as soon as possible: -breathing problems -chest pain -dizziness or feeling faint -fast, irregular heartbeat -low blood counts - this medicine may decrease the number of white blood cells, red blood cells and platelets. You may be at increased risk for infections and bleeding. -mouth sores -redness, blistering, peeling or loosening of the skin, including inside the mouth (this can be added for any serious or exfoliative rash that could lead to hospitalization) -signs of infection - fever or chills, cough, sore throat, pain or difficulty passing urine -signs and symptoms of kidney injury like trouble passing urine or change in the amount of urine -signs and symptoms of liver injury like dark yellow or brown urine; general ill feeling or flu-like symptoms; light-colored stools; loss of appetite; nausea; right upper belly pain; unusually weak or tired; yellowing of the eyes or skin -stomach pain -vomiting Side effects that usually do not require medical attention (report to your doctor or health care professional if they continue or are  bothersome): -headache -joint pain -muscle cramps or muscle pain This list may not describe all possible side effects. Call your doctor for medical advice about side effects. You may report side effects to FDA at 1-800-FDA-1088. Where should I keep my medicine? This drug is given in a hospital or clinic and will not be stored at home. NOTE: This sheet is a summary. It may not cover all possible information. If you have questions about this medicine, talk to your doctor, pharmacist, or health care provider.  2018 Elsevier/Gold Standard (2016-03-22 15:28:09)  Rituximab injection What is this medicine? RITUXIMAB (ri TUX i mab) is a monoclonal antibody. It is used to treat certain types of cancer like non-Hodgkin lymphoma and chronic lymphocytic leukemia. It is also used to treat rheumatoid arthritis, granulomatosis with polyangiitis (or Wegener's granulomatosis), and microscopic polyangiitis. This medicine may be used for other purposes; ask your health care provider or pharmacist if you have questions. COMMON BRAND NAME(S): Rituxan What should I tell my health care provider before I take this medicine? They need to know if you have any of these conditions: -heart disease -infection (especially a virus infection such as hepatitis B, chickenpox, cold sores, or herpes) -immune system problems -irregular heartbeat -kidney disease -lung or breathing disease, like asthma -recently received or scheduled to receive a vaccine -an unusual or allergic reaction to rituximab, mouse proteins, other medicines, foods, dyes, or preservatives -pregnant or trying to get pregnant -breast-feeding How should I use this medicine? This medicine is for infusion  into a vein. It is administered in a hospital or clinic by a specially trained health care professional. A special MedGuide will be given to you by the pharmacist with each prescription and refill. Be sure to read this information carefully each  time. Talk to your pediatrician regarding the use of this medicine in children. This medicine is not approved for use in children. Overdosage: If you think you have taken too much of this medicine contact a poison control center or emergency room at once. NOTE: This medicine is only for you. Do not share this medicine with others. What if I miss a dose? It is important not to miss a dose. Call your doctor or health care professional if you are unable to keep an appointment. What may interact with this medicine? -cisplatin -other medicines for arthritis like disease modifying antirheumatic drugs or tumor necrosis factor inhibitors -live virus vaccines This list may not describe all possible interactions. Give your health care provider a list of all the medicines, herbs, non-prescription drugs, or dietary supplements you use. Also tell them if you smoke, drink alcohol, or use illegal drugs. Some items may interact with your medicine. What should I watch for while using this medicine? Your condition will be monitored carefully while you are receiving this medicine. You may need blood work done while you are taking this medicine. This medicine can cause serious allergic reactions. To reduce your risk you may need to take medicine before treatment with this medicine. Take your medicine as directed. In some patients, this medicine may cause a serious brain infection that may cause death. If you have any problems seeing, thinking, speaking, walking, or standing, tell your doctor right away. If you cannot reach your doctor, urgently seek other source of medical care. Call your doctor or health care professional for advice if you get a fever, chills or sore throat, or other symptoms of a cold or flu. Do not treat yourself. This drug decreases your body's ability to fight infections. Try to avoid being around people who are sick. Do not become pregnant while taking this medicine or for 12 months after stopping  it. Women should inform their doctor if they wish to become pregnant or think they might be pregnant. There is a potential for serious side effects to an unborn child. Talk to your health care professional or pharmacist for more information. What side effects may I notice from receiving this medicine? Side effects that you should report to your doctor or health care professional as soon as possible: -breathing problems -chest pain -dizziness or feeling faint -fast, irregular heartbeat -low blood counts - this medicine may decrease the number of white blood cells, red blood cells and platelets. You may be at increased risk for infections and bleeding. -mouth sores -redness, blistering, peeling or loosening of the skin, including inside the mouth (this can be added for any serious or exfoliative rash that could lead to hospitalization) -signs of infection - fever or chills, cough, sore throat, pain or difficulty passing urine -signs and symptoms of kidney injury like trouble passing urine or change in the amount of urine -signs and symptoms of liver injury like dark yellow or brown urine; general ill feeling or flu-like symptoms; light-colored stools; loss of appetite; nausea; right upper belly pain; unusually weak or tired; yellowing of the eyes or skin -stomach pain -vomiting Side effects that usually do not require medical attention (report to your doctor or health care professional if they continue or are bothersome): -  headache -joint pain -muscle cramps or muscle pain This list may not describe all possible side effects. Call your doctor for medical advice about side effects. You may report side effects to FDA at 1-800-FDA-1088. Where should I keep my medicine? This drug is given in a hospital or clinic and will not be stored at home. NOTE: This sheet is a summary. It may not cover all possible information. If you have questions about this medicine, talk to your doctor, pharmacist, or health  care provider.  2018 Elsevier/Gold Standard (2016-03-22 15:28:09)   Raynaud Phenomenon Raynaud phenomenon is a condition that affects the blood vessels (arteries) that carry blood to your fingers and toes. The arteries that supply blood to your ears or the tip of your nose might also be affected. Raynaud phenomenon causes the arteries to temporarily narrow. As a result, the flow of blood to the affected areas is temporarily decreased. This usually occurs in response to cold temperatures or stress. During an attack, the skin in the affected areas turns white. You may also feel tingling or numbness in those areas. Attacks usually last for only a brief period, and then the blood flow to the area returns to normal. In most cases, Raynaud phenomenon does not cause serious health problems. What are the causes? For many people with this condition, the cause is not known. Raynaud phenomenon is sometimes associated with other diseases, such as scleroderma or lupus. What increases the risk? Raynaud phenomenon can affect anyone, but it develops most often in people who are 46-54 years old. It affects more females than males. What are the signs or symptoms? Symptoms of Raynaud phenomenon may occur when you are exposed to cold temperatures or when you have emotional stress. The symptoms may last for a few minutes or up to several hours. They usually affect your fingers but may also affect your toes, ears, or the tip of your nose. Symptoms may include:  Changes in skin color. The skin in the affected areas will turn pale or white. The skin may then change from white to bluish to red as normal blood flow returns to the area.  Numbness, tingling, or pain in the affected areas.  In severe cases, sores may develop in the affected areas. How is this diagnosed? Your health care provider will do a physical exam and take your medical history. You may be asked to put your hands in cold water to check for a reaction to  cold temperature. Blood tests may be done to check for other diseases or conditions. Your health care provider may also order a test to check the movement of blood through your arteries and veins (vascular ultrasound). How is this treated? Treatment often involves making lifestyle changes and taking steps to control your exposure to cold temperatures. For more severe cases, medicine (calcium channel blockers) may be used to improve blood flow. Surgery is sometimes done to block the nerves that control the affected arteries, but this is rare. Follow these instructions at home:  Avoid exposure to cold by taking these steps: ? If possible, stay indoors during cold weather. ? When you go outside during cold weather, dress in layers and wear mittens, a hat, a scarf, and warm footwear. ? Wear mittens or gloves when handling ice or frozen food. ? Use holders for glasses or cans containing cold drinks. ? Let warm water run for a while before taking a shower or bath. ? Warm up the car before driving in cold weather.  If  possible, avoid stressful and emotional situations. Exercise, meditation, and yoga may help you cope with stress. Biofeedback may be useful.  Do not use any tobacco products, including cigarettes, chewing tobacco, or electronic cigarettes. If you need help quitting, ask your health care provider.  Avoid secondhand smoke.  Limit your use of caffeine. Switch to decaffeinated coffee, tea, and soda. Avoid chocolate.  Wear loose fitting socks and comfortable, roomy shoes.  Avoid vibrating tools and machinery.  Take medicines only as directed by your health care provider. Contact a health care provider if:  Your discomfort becomes worse despite lifestyle changes.  You develop sores on your fingers or toes that do not heal.  Your fingers or toes turn black.  You have breaks in the skin on your fingers or toes.  You have a fever.  You have pain or swelling in your joints.  You  have a rash.  Your symptoms occur on only one side of your body. This information is not intended to replace advice given to you by your health care provider. Make sure you discuss any questions you have with your health care provider. Document Released: 08/11/2000 Document Revised: 01/20/2016 Document Reviewed: 02/16/2016 Elsevier Interactive Patient Education  2017 Elsevier Inc.    Amlodipine tablets What is this medicine? AMLODIPINE (am LOE di peen) is a calcium-channel blocker. It affects the amount of calcium found in your heart and muscle cells. This relaxes your blood vessels, which can reduce the amount of work the heart has to do. This medicine is used to lower high blood pressure. It is also used to prevent chest pain. This medicine may be used for other purposes; ask your health care provider or pharmacist if you have questions. COMMON BRAND NAME(S): Norvasc What should I tell my health care provider before I take this medicine? They need to know if you have any of these conditions: -heart problems like heart failure or aortic stenosis -liver disease -an unusual or allergic reaction to amlodipine, other medicines, foods, dyes, or preservatives -pregnant or trying to get pregnant -breast-feeding How should I use this medicine? Take this medicine by mouth with a glass of water. Follow the directions on the prescription label. Take your medicine at regular intervals. Do not take more medicine than directed. Talk to your pediatrician regarding the use of this medicine in children. Special care may be needed. This medicine has been used in children as young as 6. Persons over 56 years old may have a stronger reaction to this medicine and need smaller doses. Overdosage: If you think you have taken too much of this medicine contact a poison control center or emergency room at once. NOTE: This medicine is only for you. Do not share this medicine with others. What if I miss a dose? If  you miss a dose, take it as soon as you can. If it is almost time for your next dose, take only that dose. Do not take double or extra doses. What may interact with this medicine? -herbal or dietary supplements -local or general anesthetics -medicines for high blood pressure -medicines for prostate problems -rifampin This list may not describe all possible interactions. Give your health care provider a list of all the medicines, herbs, non-prescription drugs, or dietary supplements you use. Also tell them if you smoke, drink alcohol, or use illegal drugs. Some items may interact with your medicine. What should I watch for while using this medicine? Visit your doctor or health care professional for regular check ups.  Check your blood pressure and pulse rate regularly. Ask your health care professional what your blood pressure and pulse rate should be, and when you should contact him or her. This medicine may make you feel confused, dizzy or lightheaded. Do not drive, use machinery, or do anything that needs mental alertness until you know how this medicine affects you. To reduce the risk of dizzy or fainting spells, do not sit or stand up quickly, especially if you are an older patient. Avoid alcoholic drinks; they can make you more dizzy. Do not suddenly stop taking amlodipine. Ask your doctor or health care professional how you can gradually reduce the dose. What side effects may I notice from receiving this medicine? Side effects that you should report to your doctor or health care professional as soon as possible: -allergic reactions like skin rash, itching or hives, swelling of the face, lips, or tongue -breathing problems -changes in vision or hearing -chest pain -fast, irregular heartbeat -swelling of legs or ankles Side effects that usually do not require medical attention (report to your doctor or health care professional if they continue or are bothersome): -dry mouth -facial  flushing -nausea, vomiting -stomach gas, pain -tired, weak -trouble sleeping This list may not describe all possible side effects. Call your doctor for medical advice about side effects. You may report side effects to FDA at 1-800-FDA-1088. Where should I keep my medicine? Keep out of the reach of children. Store at room temperature between 59 and 86 degrees F (15 and 30 degrees C). Protect from light. Keep container tightly closed. Throw away any unused medicine after the expiration date. NOTE: This sheet is a summary. It may not cover all possible information. If you have questions about this medicine, talk to your doctor, pharmacist, or health care provider.  2018 Elsevier/Gold Standard (2012-07-12 11:40:58)

## 2017-09-18 NOTE — Patient Instructions (Signed)
Thank you for choosing [facility] to provide your oncology and hematology care.  To afford each patient quality time with our providers, please arrive 30 minutes before your scheduled appointment time.  If you arrive late for your appointment, you may be asked to reschedule.  We strive to give you quality time with our providers, and arriving late affects you and other patients whose appointments are after yours.   If you are a no show for multiple scheduled visits, you may be dismissed from the clinic at the providers discretion.    Again, thank you for choosing [facility], our hope is that these requests will decrease the amount of time that you wait before being seen by our physicians.  ______________________________________________________________________  Should you have questions after your visit to the  Cancer Center, please contact our office at [phone] between the hours of 8:30 and 4:30 p.m.    Voicemails left after 4:30p.m will not be returned until the following business day.    For prescription refill requests, please have your pharmacy contact us directly.  Please also try to allow 48 hours for prescription requests.    Please contact the scheduling department for questions regarding scheduling.  For scheduling of procedures such as PET scans, CT scans, MRI, Ultrasound, etc please contact central scheduling at [phone].    Resources For Cancer Patients and Caregivers:   Oncolink.org:  A wonderful resource for patients and healthcare providers for information regarding your disease, ways to tract your treatment, what to expect, etc.     American Cancer Society:  800-227-2345  Can help patients locate various types of support and financial assistance  Cancer Care: 1-800-813-HOPE (4673) Provides financial assistance, online support groups, medication/co-pay assistance.    [place] DSS:  336-641-3447 Where to apply for food  stamps, Medicaid, and utility assistance  Medicare Rights Center: 800-333-4114 Helps people with Medicare understand their rights and benefits, navigate the Medicare system, and secure the quality healthcare they deserve  [employer]: 336-333-6589 [employer] [employer]'s shared-ride transportation service for eligible riders who have a disability that prevents them from riding the fixed route bus.    For additional information on assistance programs please contact our social worker:   Grier Hock/Abigail Elmore:  [phone]            

## 2017-09-25 ENCOUNTER — Inpatient Hospital Stay: Payer: Medicare Other

## 2017-09-25 VITALS — BP 134/67 | HR 89 | Temp 98.9°F | Resp 16

## 2017-09-25 DIAGNOSIS — Z5112 Encounter for antineoplastic immunotherapy: Secondary | ICD-10-CM | POA: Diagnosis not present

## 2017-09-25 DIAGNOSIS — C8307 Small cell B-cell lymphoma, spleen: Secondary | ICD-10-CM

## 2017-09-25 DIAGNOSIS — Z7189 Other specified counseling: Secondary | ICD-10-CM

## 2017-09-25 LAB — CBC WITH DIFFERENTIAL (CANCER CENTER ONLY)
BASOS PCT: 1 %
Basophils Absolute: 0 10*3/uL (ref 0.0–0.1)
EOS PCT: 1 %
Eosinophils Absolute: 0 10*3/uL (ref 0.0–0.5)
HEMATOCRIT: 33.5 % — AB (ref 38.4–49.9)
Hemoglobin: 11.5 g/dL — ABNORMAL LOW (ref 13.0–17.1)
LYMPHS PCT: 21 %
Lymphs Abs: 1 10*3/uL (ref 0.9–3.3)
MCH: 31.1 pg (ref 27.2–33.4)
MCHC: 34.5 g/dL (ref 32.0–36.0)
MCV: 90.1 fL (ref 79.3–98.0)
MONO ABS: 0.3 10*3/uL (ref 0.1–0.9)
MONOS PCT: 6 %
NEUTROS ABS: 3.6 10*3/uL (ref 1.5–6.5)
Neutrophils Relative %: 73 %
Platelet Count: 121 10*3/uL — ABNORMAL LOW (ref 140–400)
RBC: 3.71 MIL/uL — ABNORMAL LOW (ref 4.20–5.82)
RDW: 18.1 % — AB (ref 11.0–15.6)
WBC Count: 4.9 10*3/uL (ref 4.0–10.3)

## 2017-09-25 LAB — COMPREHENSIVE METABOLIC PANEL
ALBUMIN: 3.6 g/dL (ref 3.5–5.0)
ALK PHOS: 76 U/L (ref 40–150)
ALT: 19 U/L (ref 0–55)
ANION GAP: 9 (ref 3–11)
AST: 17 U/L (ref 5–34)
BUN: 21 mg/dL (ref 7–26)
CALCIUM: 9 mg/dL (ref 8.4–10.4)
CO2: 26 mmol/L (ref 22–29)
Chloride: 103 mmol/L (ref 98–109)
Creatinine, Ser: 0.86 mg/dL (ref 0.70–1.30)
GFR calc Af Amer: >60 mL/min (ref >60)
GFR calc non Af Amer: >60 mL/min (ref >60)
GLUCOSE: 166 mg/dL — AB (ref 70–140)
Potassium: 3.8 mmol/L (ref 3.5–5.1)
SODIUM: 138 mmol/L (ref 136–145)
Total Bilirubin: 0.4 mg/dL (ref 0.2–1.2)
Total Protein: 7.7 g/dL (ref 6.4–8.3)

## 2017-09-25 MED ORDER — SODIUM CHLORIDE 0.9 % IV SOLN
Freq: Once | INTRAVENOUS | Status: AC
  Administered 2017-09-25: 12:00:00 via INTRAVENOUS

## 2017-09-25 MED ORDER — DIPHENHYDRAMINE HCL 25 MG PO CAPS
50.0000 mg | ORAL_CAPSULE | Freq: Once | ORAL | Status: AC
  Administered 2017-09-25: 50 mg via ORAL

## 2017-09-25 MED ORDER — ACETAMINOPHEN 325 MG PO TABS
650.0000 mg | ORAL_TABLET | Freq: Once | ORAL | Status: AC
  Administered 2017-09-25: 650 mg via ORAL

## 2017-09-25 MED ORDER — METHYLPREDNISOLONE SODIUM SUCC 125 MG IJ SOLR
125.0000 mg | Freq: Every day | INTRAMUSCULAR | Status: DC
  Administered 2017-09-25: 125 mg via INTRAVENOUS

## 2017-09-25 MED ORDER — SODIUM CHLORIDE 0.9 % IV SOLN
375.0000 mg/m2 | Freq: Once | INTRAVENOUS | Status: AC
  Administered 2017-09-25: 700 mg via INTRAVENOUS
  Filled 2017-09-25: qty 50

## 2017-09-25 MED ORDER — FAMOTIDINE IN NACL 20-0.9 MG/50ML-% IV SOLN
20.0000 mg | Freq: Once | INTRAVENOUS | Status: AC
  Administered 2017-09-25: 20 mg via INTRAVENOUS

## 2017-09-25 NOTE — Patient Instructions (Signed)
[facility] Discharge Instructions for Patients Receiving Chemotherapy  Today you received the following chemotherapy agents Rituxan.  To help prevent nausea and vomiting after your treatment, we encourage you to take your nausea medication as prescribed.  If you develop nausea and vomiting that is not controlled by your nausea medication, call the clinic.   BELOW ARE SYMPTOMS THAT SHOULD BE REPORTED IMMEDIATELY:  *FEVER GREATER THAN 100.5 F  *CHILLS WITH OR WITHOUT FEVER  NAUSEA AND VOMITING THAT IS NOT CONTROLLED WITH YOUR NAUSEA MEDICATION  *UNUSUAL SHORTNESS OF BREATH  *UNUSUAL BRUISING OR BLEEDING  TENDERNESS IN MOUTH AND THROAT WITH OR WITHOUT PRESENCE OF ULCERS  *URINARY PROBLEMS  *BOWEL PROBLEMS  UNUSUAL RASH Items with * indicate a potential emergency and should be followed up as soon as possible.  Feel free to call the clinic should you have any questions or concerns. The clinic phone number is [phone].  Please show the [place] [facility] at check-in to the Emergency Department and triage nurse.  Rituximab injection What is this medicine? RITUXIMAB (ri TUX i mab) is a monoclonal antibody. It is used to treat certain types of cancer like non-Hodgkin lymphoma and chronic lymphocytic leukemia. It is also used to treat rheumatoid arthritis, granulomatosis with polyangiitis (or Wegener's granulomatosis), and microscopic polyangiitis. This medicine may be used for other purposes; ask your health care provider or pharmacist if you have questions. COMMON BRAND NAME(S): Rituxan What should I tell my health care provider before I take this medicine? They need to know if you have any of these conditions: -heart disease -infection (especially a virus infection such as hepatitis B, chickenpox, cold sores, or herpes) -immune system problems -irregular heartbeat -kidney disease -lung or breathing disease, like asthma -recently received or scheduled to  receive a vaccine -an unusual or allergic reaction to rituximab, mouse proteins, other medicines, foods, dyes, or preservatives -pregnant or trying to get pregnant -breast-feeding How should I use this medicine? This medicine is for infusion into a vein. It is administered in a hospital or clinic by a specially trained health care professional. A special MedGuide will be given to you by the pharmacist with each prescription and refill. Be sure to read this information carefully each time. Talk to your pediatrician regarding the use of this medicine in children. This medicine is not approved for use in children. Overdosage: If you think you have taken too much of this medicine contact a poison control center or emergency room at once. NOTE: This medicine is only for you. Do not share this medicine with others. What if I miss a dose? It is important not to miss a dose. Call your doctor or health care professional if you are unable to keep an appointment. What may interact with this medicine? -cisplatin -other medicines for arthritis like disease modifying antirheumatic drugs or tumor necrosis factor inhibitors -live virus vaccines This list may not describe all possible interactions. Give your health care provider a list of all the medicines, herbs, non-prescription drugs, or dietary supplements you use. Also tell them if you smoke, drink alcohol, or use illegal drugs. Some items may interact with your medicine. What should I watch for while using this medicine? Your condition will be monitored carefully while you are receiving this medicine. You may need blood work done while you are taking this medicine. This medicine can cause serious allergic reactions. To reduce your risk you may need to take medicine before treatment with this medicine. Take your medicine as  directed. In some patients, this medicine may cause a serious brain infection that may cause death. If you have any problems seeing,  thinking, speaking, walking, or standing, tell your doctor right away. If you cannot reach your doctor, urgently seek other source of medical care. Call your doctor or health care professional for advice if you get a fever, chills or sore throat, or other symptoms of a cold or flu. Do not treat yourself. This drug decreases your body's ability to fight infections. Try to avoid being around people who are sick. Do not become pregnant while taking this medicine or for 12 months after stopping it. Women should inform their doctor if they wish to become pregnant or think they might be pregnant. There is a potential for serious side effects to an unborn child. Talk to your health care professional or pharmacist for more information. What side effects may I notice from receiving this medicine? Side effects that you should report to your doctor or health care professional as soon as possible: -breathing problems -chest pain -dizziness or feeling faint -fast, irregular heartbeat -low blood counts - this medicine may decrease the number of white blood cells, red blood cells and platelets. You may be at increased risk for infections and bleeding. -mouth sores -redness, blistering, peeling or loosening of the skin, including inside the mouth (this can be added for any serious or exfoliative rash that could lead to hospitalization) -signs of infection - fever or chills, cough, sore throat, pain or difficulty passing urine -signs and symptoms of kidney injury like trouble passing urine or change in the amount of urine -signs and symptoms of liver injury like dark yellow or brown urine; general ill feeling or flu-like symptoms; light-colored stools; loss of appetite; nausea; right upper belly pain; unusually weak or tired; yellowing of the eyes or skin -stomach pain -vomiting Side effects that usually do not require medical attention (report to your doctor or health care professional if they continue or are  bothersome): -headache -joint pain -muscle cramps or muscle pain This list may not describe all possible side effects. Call your doctor for medical advice about side effects. You may report side effects to FDA at 1-800-FDA-1088. Where should I keep my medicine? This drug is given in a hospital or clinic and will not be stored at home. NOTE: This sheet is a summary. It may not cover all possible information. If you have questions about this medicine, talk to your doctor, pharmacist, or health care provider.  2018 Elsevier/Gold Standard (2016-03-22 15:28:09)  Rituximab injection What is this medicine? RITUXIMAB (ri TUX i mab) is a monoclonal antibody. It is used to treat certain types of cancer like non-Hodgkin lymphoma and chronic lymphocytic leukemia. It is also used to treat rheumatoid arthritis, granulomatosis with polyangiitis (or Wegener's granulomatosis), and microscopic polyangiitis. This medicine may be used for other purposes; ask your health care provider or pharmacist if you have questions. COMMON BRAND NAME(S): Rituxan What should I tell my health care provider before I take this medicine? They need to know if you have any of these conditions: -heart disease -infection (especially a virus infection such as hepatitis B, chickenpox, cold sores, or herpes) -immune system problems -irregular heartbeat -kidney disease -lung or breathing disease, like asthma -recently received or scheduled to receive a vaccine -an unusual or allergic reaction to rituximab, mouse proteins, other medicines, foods, dyes, or preservatives -pregnant or trying to get pregnant -breast-feeding How should I use this medicine? This medicine is for infusion  into a vein. It is administered in a hospital or clinic by a specially trained health care professional. A special MedGuide will be given to you by the pharmacist with each prescription and refill. Be sure to read this information carefully each  time. Talk to your pediatrician regarding the use of this medicine in children. This medicine is not approved for use in children. Overdosage: If you think you have taken too much of this medicine contact a poison control center or emergency room at once. NOTE: This medicine is only for you. Do not share this medicine with others. What if I miss a dose? It is important not to miss a dose. Call your doctor or health care professional if you are unable to keep an appointment. What may interact with this medicine? -cisplatin -other medicines for arthritis like disease modifying antirheumatic drugs or tumor necrosis factor inhibitors -live virus vaccines This list may not describe all possible interactions. Give your health care provider a list of all the medicines, herbs, non-prescription drugs, or dietary supplements you use. Also tell them if you smoke, drink alcohol, or use illegal drugs. Some items may interact with your medicine. What should I watch for while using this medicine? Your condition will be monitored carefully while you are receiving this medicine. You may need blood work done while you are taking this medicine. This medicine can cause serious allergic reactions. To reduce your risk you may need to take medicine before treatment with this medicine. Take your medicine as directed. In some patients, this medicine may cause a serious brain infection that may cause death. If you have any problems seeing, thinking, speaking, walking, or standing, tell your doctor right away. If you cannot reach your doctor, urgently seek other source of medical care. Call your doctor or health care professional for advice if you get a fever, chills or sore throat, or other symptoms of a cold or flu. Do not treat yourself. This drug decreases your body's ability to fight infections. Try to avoid being around people who are sick. Do not become pregnant while taking this medicine or for 12 months after stopping  it. Women should inform their doctor if they wish to become pregnant or think they might be pregnant. There is a potential for serious side effects to an unborn child. Talk to your health care professional or pharmacist for more information. What side effects may I notice from receiving this medicine? Side effects that you should report to your doctor or health care professional as soon as possible: -breathing problems -chest pain -dizziness or feeling faint -fast, irregular heartbeat -low blood counts - this medicine may decrease the number of white blood cells, red blood cells and platelets. You may be at increased risk for infections and bleeding. -mouth sores -redness, blistering, peeling or loosening of the skin, including inside the mouth (this can be added for any serious or exfoliative rash that could lead to hospitalization) -signs of infection - fever or chills, cough, sore throat, pain or difficulty passing urine -signs and symptoms of kidney injury like trouble passing urine or change in the amount of urine -signs and symptoms of liver injury like dark yellow or brown urine; general ill feeling or flu-like symptoms; light-colored stools; loss of appetite; nausea; right upper belly pain; unusually weak or tired; yellowing of the eyes or skin -stomach pain -vomiting Side effects that usually do not require medical attention (report to your doctor or health care professional if they continue or are bothersome): -  headache -joint pain -muscle cramps or muscle pain This list may not describe all possible side effects. Call your doctor for medical advice about side effects. You may report side effects to FDA at 1-800-FDA-1088. Where should I keep my medicine? This drug is given in a hospital or clinic and will not be stored at home. NOTE: This sheet is a summary. It may not cover all possible information. If you have questions about this medicine, talk to your doctor, pharmacist, or health  care provider.  2018 Elsevier/Gold Standard (2016-03-22 15:28:09)   Raynaud Phenomenon Raynaud phenomenon is a condition that affects the blood vessels (arteries) that carry blood to your fingers and toes. The arteries that supply blood to your ears or the tip of your nose might also be affected. Raynaud phenomenon causes the arteries to temporarily narrow. As a result, the flow of blood to the affected areas is temporarily decreased. This usually occurs in response to cold temperatures or stress. During an attack, the skin in the affected areas turns white. You may also feel tingling or numbness in those areas. Attacks usually last for only a brief period, and then the blood flow to the area returns to normal. In most cases, Raynaud phenomenon does not cause serious health problems. What are the causes? For many people with this condition, the cause is not known. Raynaud phenomenon is sometimes associated with other diseases, such as scleroderma or lupus. What increases the risk? Raynaud phenomenon can affect anyone, but it develops most often in people who are 39-30 years old. It affects more females than males. What are the signs or symptoms? Symptoms of Raynaud phenomenon may occur when you are exposed to cold temperatures or when you have emotional stress. The symptoms may last for a few minutes or up to several hours. They usually affect your fingers but may also affect your toes, ears, or the tip of your nose. Symptoms may include:  Changes in skin color. The skin in the affected areas will turn pale or white. The skin may then change from white to bluish to red as normal blood flow returns to the area.  Numbness, tingling, or pain in the affected areas.  In severe cases, sores may develop in the affected areas. How is this diagnosed? Your health care provider will do a physical exam and take your medical history. You may be asked to put your hands in cold water to check for a reaction to  cold temperature. Blood tests may be done to check for other diseases or conditions. Your health care provider may also order a test to check the movement of blood through your arteries and veins (vascular ultrasound). How is this treated? Treatment often involves making lifestyle changes and taking steps to control your exposure to cold temperatures. For more severe cases, medicine (calcium channel blockers) may be used to improve blood flow. Surgery is sometimes done to block the nerves that control the affected arteries, but this is rare. Follow these instructions at home:  Avoid exposure to cold by taking these steps: ? If possible, stay indoors during cold weather. ? When you go outside during cold weather, dress in layers and wear mittens, a hat, a scarf, and warm footwear. ? Wear mittens or gloves when handling ice or frozen food. ? Use holders for glasses or cans containing cold drinks. ? Let warm water run for a while before taking a shower or bath. ? Warm up the car before driving in cold weather.  If  possible, avoid stressful and emotional situations. Exercise, meditation, and yoga may help you cope with stress. Biofeedback may be useful.  Do not use any tobacco products, including cigarettes, chewing tobacco, or electronic cigarettes. If you need help quitting, ask your health care provider.  Avoid secondhand smoke.  Limit your use of caffeine. Switch to decaffeinated coffee, tea, and soda. Avoid chocolate.  Wear loose fitting socks and comfortable, roomy shoes.  Avoid vibrating tools and machinery.  Take medicines only as directed by your health care provider. Contact a health care provider if:  Your discomfort becomes worse despite lifestyle changes.  You develop sores on your fingers or toes that do not heal.  Your fingers or toes turn black.  You have breaks in the skin on your fingers or toes.  You have a fever.  You have pain or swelling in your joints.  You  have a rash.  Your symptoms occur on only one side of your body. This information is not intended to replace advice given to you by your health care provider. Make sure you discuss any questions you have with your health care provider. Document Released: 08/11/2000 Document Revised: 01/20/2016 Document Reviewed: 02/16/2016 Elsevier Interactive Patient Education  2017 [employer]    Amlodipine tablets What is this medicine? AMLODIPINE (am LOE di peen) is a calcium-channel blocker. It affects the amount of calcium found in your heart and muscle cells. This relaxes your blood vessels, which can reduce the amount of work the heart has to do. This medicine is used to lower high blood pressure. It is also used to prevent chest pain. This medicine may be used for other purposes; ask your health care provider or pharmacist if you have questions. COMMON BRAND NAME(S): Norvasc What should I tell my health care provider before I take this medicine? They need to know if you have any of these conditions: -heart problems like heart failure or aortic stenosis -liver disease -an unusual or allergic reaction to amlodipine, other medicines, foods, dyes, or preservatives -pregnant or trying to get pregnant -breast-feeding How should I use this medicine? Take this medicine by mouth with a glass of water. Follow the directions on the prescription label. Take your medicine at regular intervals. Do not take more medicine than directed. Talk to your pediatrician regarding the use of this medicine in children. Special care may be needed. This medicine has been used in children as young as 6. Persons over 58 years old may have a stronger reaction to this medicine and need smaller doses. Overdosage: If you think you have taken too much of this medicine contact a poison control center or emergency room at once. NOTE: This medicine is only for you. Do not share this medicine with others. What if I miss a dose? If  you miss a dose, take it as soon as you can. If it is almost time for your next dose, take only that dose. Do not take double or extra doses. What may interact with this medicine? -herbal or dietary supplements -local or general anesthetics -medicines for high blood pressure -medicines for prostate problems -rifampin This list may not describe all possible interactions. Give your health care provider a list of all the medicines, herbs, non-prescription drugs, or dietary supplements you use. Also tell them if you smoke, drink alcohol, or use illegal drugs. Some items may interact with your medicine. What should I watch for while using this medicine? Visit your doctor or health care professional for regular check ups.  Check your blood pressure and pulse rate regularly. Ask your health care professional what your blood pressure and pulse rate should be, and when you should contact him or her. This medicine may make you feel confused, dizzy or lightheaded. Do not drive, use machinery, or do anything that needs mental alertness until you know how this medicine affects you. To reduce the risk of dizzy or fainting spells, do not sit or stand up quickly, especially if you are an older patient. Avoid alcoholic drinks; they can make you more dizzy. Do not suddenly stop taking amlodipine. Ask your doctor or health care professional how you can gradually reduce the dose. What side effects may I notice from receiving this medicine? Side effects that you should report to your doctor or health care professional as soon as possible: -allergic reactions like skin rash, itching or hives, swelling of the face, lips, or tongue -breathing problems -changes in vision or hearing -chest pain -fast, irregular heartbeat -swelling of legs or ankles Side effects that usually do not require medical attention (report to your doctor or health care professional if they continue or are bothersome): -dry mouth -facial  flushing -nausea, vomiting -stomach gas, pain -tired, weak -trouble sleeping This list may not describe all possible side effects. Call your doctor for medical advice about side effects. You may report side effects to FDA at 1-800-FDA-1088. Where should I keep my medicine? Keep out of the reach of children. Store at room temperature between 59 and 86 degrees F (15 and 30 degrees C). Protect from light. Keep container tightly closed. Throw away any unused medicine after the expiration date. NOTE: This sheet is a summary. It may not cover all possible information. If you have questions about this medicine, talk to your doctor, pharmacist, or health care provider.  2018 Elsevier/Gold Standard (2012-07-12 11:40:58)

## 2017-09-27 NOTE — Progress Notes (Signed)
HEMATOLOGY/ONCOLOGY CLINIC NOTE  Date of Service: 10/02/17  Patient Care Team: Christain Sacramento, MD as PCP - General (Family Medicine)  CHIEF COMPLAINTS/PURPOSE OF CONSULTATION:  F/u for SMZL  HISTORY OF PRESENTING ILLNESS:  Jared Nixon is a wonderful 67 y.o. male who has been referred to [place] by Dr Redmond Pulling, [name], MD for evaluation and management of new splenomegaly with lymphocytosis.  Initially, the patient presented into his PCP's office on 06/11/17 complaining of a small lump under his left lower ribcage which had been present for several weeks. This was palpable, per his PCP's notes and Dr Redmond Pulling subsequently ordered a CT A/P to evaluate this further. This was performed on 06/13/17 which confirmed the presence of splenomegaly and portal caval lymphadenopathy which was concerning for lymphoproliferative disease. He also had lab work (summarized as below) which also showed some gradual leukocytosis/lymphocytosis, with worsening anemia and thrombocytopenia. He denies any pain over the area of his splenomegaly, difficulty eating or premature satiation.   In a general sense, he had not had an significant chronic health issues which he is being treated for. He does struggle with some tinnitus involving only the left ear which began around the end of July and has somewhat worsened since. One year ago he also had bilateral inguinal hernia surgery and a basal cell carcinoma excision. He also notes in the 80s that he had a gynecomastia removed, but otherwise of recently he has been feeling at his baseline state-of-health. He was initially informed that he suffered from leukocytosis earlier this year by Dr Dois Davenport office.   He did smoke for a brief time several decades ago in his 51s (0.5ppd), but none since. He does drink alcohol, and on average he drinks 7-8 beers a week. He denies any chemical/radiaiton exposures or exposure during his career. He does not partake in illicit drug usage. He has  never previously needed a blood transfusion in the past.   He is currently prescribed Clonazepam and Ritalin to use on a daily basis which are both managed by Christain Sacramento, MD. He takes these for dealing with clutter/hoarding issues ongoing within his personal life.   Of note, the patient reports that his father did have bladder cancer. His paternal Aunts all were diagnosed with cancer, but he is unsure of which types. He does have a h/o CVA on his mothers side, but nothing else of note.   On ROS, he does report a 10lbs weight loss which was observed by Dr Redmond Pulling and was attributed to his usage of Ritalin. He also notes that he does suffer from sleeping issues. He does experience fatigue intermittently, however, this is not debilitating or hindering his daily activities. He denies fever, chills, night sweats, abdominal pain, nausea, vomiting, diarrhea, rash, or any other associated symptoms.   INTERVAL HISTORY   Jared Nixon is here for f/u of his recently diagnosed SMZL presenting with lymphocytosis and mild asymptomatic anemia and thrombocytopenia. He has been overall well in the interim.  He is here for f/u prior to his 4th and last planned dose of Rituxan today. BP better controlled. Dr. Kathryne Eriksson is his PCP.   On review of symptoms, pt notes he is starting to feel fatigued and lower energy since chemo. His fatigue is more this week. He notes the whiteness of his fingers is not significantly bothersome to him. He denies nausea from treatment.    MEDICAL HISTORY:  Past Medical History:  Diagnosis Date  . Sinus  complaint   . Skin cancer     SURGICAL HISTORY: Past Surgical History:  Procedure Laterality Date  . basal cell carcinoma surgery    . CATARACT EXTRACTION      SOCIAL HISTORY: Social History   Socioeconomic History  . Marital status: Single    Spouse name: Not on file  . Number of children: Not on file  . Years of education: Not on file  . Highest education  level: Not on file  Social Needs  . Financial resource strain: Not on file  . Food insecurity - worry: Not on file  . Food insecurity - inability: Not on file  . Transportation needs - medical: Not on file  . Transportation needs - non-medical: Not on file  Occupational History  . Not on file  Tobacco Use  . Smoking status: Never Smoker  . Smokeless tobacco: Never Used  Substance and Sexual Activity  . Alcohol use: Yes    Comment: occasional  . Drug use: No  . Sexual activity: Not on file  Other Topics Concern  . Not on file  Social History Narrative  . Not on file   He is originally from [place], [place].   FAMILY HISTORY: No family history on file.  ALLERGIES:  is allergic to penicillins.  MEDICATIONS:  Current Outpatient Medications  Medication Sig Dispense Refill  . amLODipine (NORVASC) 5 MG tablet Take 1 tablet (5 mg total) by mouth daily. (Patient not taking: Reported on 09/18/2017) 30 tablet 0  . ClonazePAM (KLONOPIN PO) Take 1 mg 3 (three) times daily by mouth.     . fluticasone (FLONASE) 50 MCG/ACT nasal spray Place 1 spray into both nostrils daily.    . methylphenidate (RITALIN) 10 MG tablet Take 10 mg 3 (three) times daily by mouth.     . ondansetron (ZOFRAN ODT) 4 MG disintegrating tablet Take 1 tablet (4 mg total) by mouth every 8 (eight) hours as needed for nausea or vomiting. 20 tablet 0   No current facility-administered medications for this visit.     REVIEW OF SYSTEMS:    A 10+ POINT REVIEW OF SYSTEMS WAS OBTAINED including neurology, dermatology, psychiatry, cardiac, respiratory, lymph, extremities, GI, GU, Musculoskeletal, constitutional, breasts, reproductive, HEENT.  All pertinent positives are noted in the HPI.  All others are negative.  PHYSICAL EXAMINATION: ECOG PERFORMANCE STATUS: 1 - Symptomatic but completely ambulatory  . Vitals:   10/02/17 0936  BP: (!) 153/72  Pulse: 79  Resp: 18  Temp: 97.6 F (36.4 C)  SpO2: 100%   Filed  Weights   10/02/17 0936  Weight: 166 lb 11.2 oz (75.6 kg)   .Body mass index is 23.25 kg/m.  GENERAL:alert, in no acute distress and comfortable SKIN: no acute rashes, no significant lesions EYES: conjunctiva are pink and non-injected, sclera anicteric OROPHARYNX: MMM, no exudates, no oropharyngeal erythema or ulceration NECK: supple, no JVD LYMPH:  no palpable lymphadenopathy in the cervical, axillary or inguinal regions LUNGS: clear to auscultation b/l with normal respiratory effort HEART: regular rate & rhythm ABDOMEN:  normoactive bowel sounds , non tender. Spleen is 5 finger breadths below the left costal margin and the mid-clavicular line.  Extremity: no pedal edema PSYCH: alert & oriented x 3 with fluent speech NEURO: no focal motor/sensory deficits  LABORATORY DATA:  I have reviewed the data as listed  . CBC Latest Ref Rng & Units 10/02/2017 09/25/2017 09/18/2017  WBC 4.0 - 10.3 K/uL 6.0 4.9 10.1  Hemoglobin 13.0 - 17.1 g/dL - - -  Hematocrit 38.4 - 49.9 % 33.8(L) 33.5(L) 34.9(L)  Platelets 140 - 400 K/uL 104(L) 121(L) 94(L)  hgb 11.3 . CBC    Component Value Date/Time   WBC 6.0 10/02/2017 0845   WBC 16.6 (H) 09/11/2017 0751   RBC 3.86 (L) 10/02/2017 0845   HGB 10.8 (L) 09/11/2017 0751   HGB 11.0 (L) 08/31/2017 1101   HCT 33.8 (L) 10/02/2017 0845   HCT 33.8 (L) 08/31/2017 1101   PLT 104 (L) 10/02/2017 0845   PLT 84 (L) 08/31/2017 1101   MCV 87.6 10/02/2017 0845   MCV 91.1 08/31/2017 1101   MCH 29.3 10/02/2017 0845   MCHC 33.4 10/02/2017 0845   RDW 17.8 (H) 10/02/2017 0845   RDW 17.3 (H) 08/31/2017 1101   LYMPHSABS 0.9 10/02/2017 0845   LYMPHSABS 11.4 (H) 08/31/2017 1101   MONOABS 0.4 10/02/2017 0845   MONOABS 0.7 08/31/2017 1101   EOSABS 0.0 10/02/2017 0845   EOSABS 0.1 08/31/2017 1101   BASOSABS 0.0 10/02/2017 0845   BASOSABS 0.0 08/31/2017 1101    . CMP Latest Ref Rng & Units 10/02/2017 09/25/2017 09/18/2017  Glucose 70 - 140 mg/dL 148(H) 166(H) 132    BUN 7 - 26 mg/dL 26 21 20   Creatinine 0.70 - 1.30 mg/dL 0.82 0.86 0.83  Sodium 136 - 145 mmol/L 139 138 138  Potassium 3.5 - 5.1 mmol/L 3.9 3.8 3.9  Chloride 98 - 109 mmol/L 105 103 105  CO2 22 - 29 mmol/L 24 26 25   Calcium 8.4 - 10.4 mg/dL 9.2 9.0 9.2  Total Protein 6.4 - 8.3 g/dL 7.6 7.7 7.5  Total Bilirubin 0.2 - 1.2 mg/dL 0.4 0.4 0.3  Alkaline Phos 40 - 150 U/L 69 76 80  AST 5 - 34 U/L 17 17 24   ALT 0 - 55 U/L 17 19 27      -flow cytometry consistent with marginal zone lymphoma vs splenic Lymphoma      RADIOGRAPHIC STUDIES: I have personally reviewed the radiological images as listed and agreed with the findings in the report. No results found.  ASSESSMENT & PLAN:   Jared Nixon is a  67 y.o. 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 -Lymphocyte counts are starting to improve (down from 11.4k to 0.9k) -Pt is tolerating Rituxan well without any prohibitive toxicities. He has experienced increased fatigue but its tolerable.  -Informed pt on the process of the treatment and its goals -No evidence of TLS, I encouraged him to drink plenty of water to help reduce risk.  -No splenomegaly on physical exam today.  -He will complete 4/4 cycles of Rituxan today.  -Will do an abdominal US in 2 months to monitor response to treatment  -If he is in complete remission we can continue with observation every 2-3 months for 1-2 years or proceed with maintenance Rituxan every 2 months for 1 year. Will determine this option on f/u. Patient wants to think about this.   #2 Elevated BP #3 Raynaud's  Syndrome Plan -Encouraged pt to continue amlodipine 5mg  po daily to treat both his Raynaud's and high BP.  -Encouraged pt to check BP with an arm cuff at home and f/u for BPmx with PCP -I suggest he  contact his PCP Dr. Kathryne Eriksson about refill and increasing his amlodipine as his BP has been elevated.    Complete 4th dose of Rituxan  as ordered today [place] abd in 7 weeks RTC with Dr Irene Limbo in 8 weeks with labs    All of the patients questions were answered with apparent satisfaction. The patient knows to call the clinic with any problems, questions or concerns. Total time spent on direct patient contact 20 mins . Total time spent on clinical encounter 25 mins.   Sullivan Lone MD [place] AAHIVMS [facility] Hematology/Oncology Physician [facility]  (Office):       [phone] (Work cell):  [phone] (Fax):           [phone]  10/02/2017 9:49 AM   This document serves as a record of services personally performed by Sullivan Lone, MD. It was created on his behalf by Joslyn Devon, a trained medical scribe. The creation of this record is based on the scribe's personal observations and the provider's statements to them.    .I have reviewed the above documentation for accuracy and completeness, and I agree with the above. Brunetta Genera MD MS

## 2017-10-02 ENCOUNTER — Inpatient Hospital Stay (HOSPITAL_BASED_OUTPATIENT_CLINIC_OR_DEPARTMENT_OTHER): Payer: Medicare Other | Admitting: Hematology

## 2017-10-02 ENCOUNTER — Inpatient Hospital Stay: Payer: Medicare Other | Attending: Hematology

## 2017-10-02 ENCOUNTER — Other Ambulatory Visit: Payer: Medicare Other

## 2017-10-02 ENCOUNTER — Inpatient Hospital Stay: Payer: Medicare Other

## 2017-10-02 VITALS — BP 153/72 | HR 79 | Temp 97.6°F | Resp 18 | Ht 71.0 in | Wt 166.7 lb

## 2017-10-02 VITALS — BP 131/75 | HR 74 | Temp 98.0°F | Resp 16

## 2017-10-02 DIAGNOSIS — D649 Anemia, unspecified: Secondary | ICD-10-CM | POA: Diagnosis not present

## 2017-10-02 DIAGNOSIS — Z7189 Other specified counseling: Secondary | ICD-10-CM

## 2017-10-02 DIAGNOSIS — Z85828 Personal history of other malignant neoplasm of skin: Secondary | ICD-10-CM | POA: Insufficient documentation

## 2017-10-02 DIAGNOSIS — C8307 Small cell B-cell lymphoma, spleen: Secondary | ICD-10-CM

## 2017-10-02 DIAGNOSIS — R5383 Other fatigue: Secondary | ICD-10-CM | POA: Insufficient documentation

## 2017-10-02 DIAGNOSIS — I7300 Raynaud's syndrome without gangrene: Secondary | ICD-10-CM | POA: Diagnosis not present

## 2017-10-02 DIAGNOSIS — Z87891 Personal history of nicotine dependence: Secondary | ICD-10-CM | POA: Insufficient documentation

## 2017-10-02 DIAGNOSIS — Z79899 Other long term (current) drug therapy: Secondary | ICD-10-CM | POA: Diagnosis not present

## 2017-10-02 DIAGNOSIS — D696 Thrombocytopenia, unspecified: Secondary | ICD-10-CM

## 2017-10-02 DIAGNOSIS — Z5112 Encounter for antineoplastic immunotherapy: Secondary | ICD-10-CM | POA: Diagnosis present

## 2017-10-02 DIAGNOSIS — Z809 Family history of malignant neoplasm, unspecified: Secondary | ICD-10-CM | POA: Insufficient documentation

## 2017-10-02 DIAGNOSIS — Z8052 Family history of malignant neoplasm of bladder: Secondary | ICD-10-CM | POA: Insufficient documentation

## 2017-10-02 DIAGNOSIS — I10 Essential (primary) hypertension: Secondary | ICD-10-CM | POA: Insufficient documentation

## 2017-10-02 LAB — COMPREHENSIVE METABOLIC PANEL
ALT: 17 U/L (ref 0–55)
AST: 17 U/L (ref 5–34)
Albumin: 3.7 g/dL (ref 3.5–5.0)
Alkaline Phosphatase: 69 U/L (ref 40–150)
Anion gap: 10 (ref 3–11)
BUN: 26 mg/dL (ref 7–26)
CHLORIDE: 105 mmol/L (ref 98–109)
CO2: 24 mmol/L (ref 22–29)
CREATININE: 0.82 mg/dL (ref 0.70–1.30)
Calcium: 9.2 mg/dL (ref 8.4–10.4)
GFR calc Af Amer: >60 mL/min (ref >60)
GFR calc non Af Amer: >60 mL/min (ref >60)
GLUCOSE: 148 mg/dL — AB (ref 70–140)
Potassium: 3.9 mmol/L (ref 3.5–5.1)
SODIUM: 139 mmol/L (ref 136–145)
Total Bilirubin: 0.4 mg/dL (ref 0.2–1.2)
Total Protein: 7.6 g/dL (ref 6.4–8.3)

## 2017-10-02 LAB — CBC WITH DIFFERENTIAL (CANCER CENTER ONLY)
BASOS PCT: 0 %
Basophils Absolute: 0 10*3/uL (ref 0.0–0.1)
EOS ABS: 0 10*3/uL (ref 0.0–0.5)
EOS PCT: 1 %
HCT: 33.8 % — ABNORMAL LOW (ref 38.4–49.9)
Hemoglobin: 11.3 g/dL — ABNORMAL LOW (ref 13.0–17.1)
LYMPHS ABS: 0.9 10*3/uL (ref 0.9–3.3)
Lymphocytes Relative: 15 %
MCH: 29.3 pg (ref 27.2–33.4)
MCHC: 33.4 g/dL (ref 32.0–36.0)
MCV: 87.6 fL (ref 79.3–98.0)
MONO ABS: 0.4 10*3/uL (ref 0.1–0.9)
MONOS PCT: 7 %
NEUTROS PCT: 77 %
Neutro Abs: 4.7 10*3/uL (ref 1.5–6.5)
PLATELETS: 104 10*3/uL — AB (ref 140–400)
RBC: 3.86 MIL/uL — ABNORMAL LOW (ref 4.20–5.82)
RDW: 17.8 % — AB (ref 11.0–14.6)
WBC Count: 6 10*3/uL (ref 4.0–10.3)

## 2017-10-02 MED ORDER — RITUXIMAB CHEMO INJECTION 500 MG/50ML
375.0000 mg/m2 | Freq: Once | INTRAVENOUS | Status: AC
  Administered 2017-10-02: 700 mg via INTRAVENOUS
  Filled 2017-10-02: qty 20

## 2017-10-02 MED ORDER — DIPHENHYDRAMINE HCL 25 MG PO CAPS
ORAL_CAPSULE | ORAL | Status: AC
  Filled 2017-10-02: qty 2

## 2017-10-02 MED ORDER — FAMOTIDINE IN NACL 20-0.9 MG/50ML-% IV SOLN
INTRAVENOUS | Status: AC
  Filled 2017-10-02: qty 50

## 2017-10-02 MED ORDER — METHYLPREDNISOLONE SODIUM SUCC 125 MG IJ SOLR
125.0000 mg | Freq: Every day | INTRAMUSCULAR | Status: DC
  Administered 2017-10-02: 125 mg via INTRAVENOUS

## 2017-10-02 MED ORDER — FAMOTIDINE IN NACL 20-0.9 MG/50ML-% IV SOLN
20.0000 mg | Freq: Once | INTRAVENOUS | Status: AC
  Administered 2017-10-02: 20 mg via INTRAVENOUS

## 2017-10-02 MED ORDER — METHYLPREDNISOLONE SODIUM SUCC 125 MG IJ SOLR
INTRAMUSCULAR | Status: AC
  Filled 2017-10-02: qty 2

## 2017-10-02 MED ORDER — DIPHENHYDRAMINE HCL 25 MG PO CAPS
50.0000 mg | ORAL_CAPSULE | Freq: Once | ORAL | Status: AC
  Administered 2017-10-02: 50 mg via ORAL

## 2017-10-02 MED ORDER — ACETAMINOPHEN 325 MG PO TABS
650.0000 mg | ORAL_TABLET | Freq: Once | ORAL | Status: AC
  Administered 2017-10-02: 650 mg via ORAL

## 2017-10-02 MED ORDER — SODIUM CHLORIDE 0.9 % IV SOLN
Freq: Once | INTRAVENOUS | Status: AC
  Administered 2017-10-02: 10:00:00 via INTRAVENOUS

## 2017-10-02 MED ORDER — ACETAMINOPHEN 325 MG PO TABS
ORAL_TABLET | ORAL | Status: AC
  Filled 2017-10-02: qty 2

## 2017-10-02 NOTE — Patient Instructions (Signed)
Cancer Center Discharge Instructions for Patients Receiving Chemotherapy  Today you received the following chemotherapy agents:  Rituxan.  To help prevent nausea and vomiting after your treatment, we encourage you to take your nausea medication as directed.   If you develop nausea and vomiting that is not controlled by your nausea medication, call the clinic.   BELOW ARE SYMPTOMS THAT SHOULD BE REPORTED IMMEDIATELY:  *FEVER GREATER THAN 100.5 F  *CHILLS WITH OR WITHOUT FEVER  NAUSEA AND VOMITING THAT IS NOT CONTROLLED WITH YOUR NAUSEA MEDICATION  *UNUSUAL SHORTNESS OF BREATH  *UNUSUAL BRUISING OR BLEEDING  TENDERNESS IN MOUTH AND THROAT WITH OR WITHOUT PRESENCE OF ULCERS  *URINARY PROBLEMS  *BOWEL PROBLEMS  UNUSUAL RASH Items with * indicate a potential emergency and should be followed up as soon as possible.  Feel free to call the clinic should you have any questions or concerns. The clinic phone number is [phone].  Please show the CHEMO ALERT CARD at check-in to the Emergency Department and triage nurse.   

## 2017-10-08 ENCOUNTER — Other Ambulatory Visit: Payer: Self-pay | Admitting: Medical

## 2017-10-08 DIAGNOSIS — I7300 Raynaud's syndrome without gangrene: Secondary | ICD-10-CM

## 2017-10-11 DIAGNOSIS — I10 Essential (primary) hypertension: Secondary | ICD-10-CM | POA: Insufficient documentation

## 2017-11-20 ENCOUNTER — Ambulatory Visit (HOSPITAL_COMMUNITY)
Admission: RE | Admit: 2017-11-20 | Discharge: 2017-11-20 | Disposition: A | Discharge status: Home / Self Care | Payer: Medicare Other | Source: Ambulatory Visit | Attending: Hematology | Admitting: Hematology

## 2017-11-20 DIAGNOSIS — K8020 Calculus of gallbladder without cholecystitis without obstruction: Secondary | ICD-10-CM | POA: Insufficient documentation

## 2017-11-20 DIAGNOSIS — R161 Splenomegaly, not elsewhere classified: Secondary | ICD-10-CM | POA: Diagnosis not present

## 2017-11-20 DIAGNOSIS — C8307 Small cell B-cell lymphoma, spleen: Secondary | ICD-10-CM | POA: Diagnosis present

## 2017-11-20 DIAGNOSIS — N281 Cyst of kidney, acquired: Secondary | ICD-10-CM | POA: Insufficient documentation

## 2017-11-20 DIAGNOSIS — K769 Liver disease, unspecified: Secondary | ICD-10-CM | POA: Diagnosis not present

## 2017-11-22 NOTE — Progress Notes (Signed)
HEMATOLOGY/ONCOLOGY CLINIC NOTE  Date of Service: 11/27/17  Patient Care Team: Christain Sacramento, MD as PCP - General (Family Medicine)  CHIEF COMPLAINTS/PURPOSE OF CONSULTATION:  F/u for SMZL  HISTORY OF PRESENTING ILLNESS:  Jared Nixon is a wonderful 67 y.o. male who has been referred to [place] by Dr Redmond Pulling, [name], MD for evaluation and management of new splenomegaly with lymphocytosis.  Initially, the patient presented into his PCP's office on 06/11/17 complaining of a small lump under his left lower ribcage which had been present for several weeks. This was palpable, per his PCP's notes and Dr Redmond Pulling subsequently ordered a CT A/P to evaluate this further. This was performed on 06/13/17 which confirmed the presence of splenomegaly and portal caval lymphadenopathy which was concerning for lymphoproliferative disease. He also had lab work (summarized as below) which also showed some gradual leukocytosis/lymphocytosis, with worsening anemia and thrombocytopenia. He denies any pain over the area of his splenomegaly, difficulty eating or premature satiation.   In a general sense, he had not had an significant chronic health issues which he is being treated for. He does struggle with some tinnitus involving only the left ear which began around the end of July and has somewhat worsened since. One year ago he also had bilateral inguinal hernia surgery and a basal cell carcinoma excision. He also notes in the 80s that he had a gynecomastia removed, but otherwise of recently he has been feeling at his baseline state-of-health. He was initially informed that he suffered from leukocytosis earlier this year by Dr Dois Davenport office.   He did smoke for a brief time several decades ago in his 58s (0.5ppd), but none since. He does drink alcohol, and on average he drinks 7-8 beers a week. He denies any chemical/radiaiton exposures or exposure during his career. He does not partake in illicit drug usage. He has  never previously needed a blood transfusion in the past.   He is currently prescribed Clonazepam and Ritalin to use on a daily basis which are both managed by Christain Sacramento, MD. He takes these for dealing with clutter/hoarding issues ongoing within his personal life.   Of note, the patient reports that his father did have bladder cancer. His paternal Aunts all were diagnosed with cancer, but he is unsure of which types. He does have a h/o CVA on his mothers side, but nothing else of note.   On ROS, he does report a 10lbs weight loss which was observed by Dr Redmond Pulling and was attributed to his usage of Ritalin. He also notes that he does suffer from sleeping issues. He does experience fatigue intermittently, however, this is not debilitating or hindering his daily activities. He denies fever, chills, night sweats, abdominal pain, nausea, vomiting, diarrhea, rash, or any other associated symptoms.   INTERVAL HISTORY   Jared Nixon is here for f/u of his SMZL presenting with mild asymptomatic anemia and thrombocytopenia.   He notes after chemo he had residual side effects of chills at times, tiredness, frequent urination, constipation and joint pain in his hands. He takes prescription Flonase currently for his sinus issues. He has seen ENT for his tinnitus.   On review of symptoms, pt notes his residual side effects have much improved. He denies true night sweats, fever. He denies current mouth sores or cold sores.     MEDICAL HISTORY:  Past Medical History:  Diagnosis Date  . Sinus complaint   . Skin cancer  SURGICAL HISTORY: Past Surgical History:  Procedure Laterality Date  . basal cell carcinoma surgery    . CATARACT EXTRACTION      SOCIAL HISTORY: Social History   Socioeconomic History  . Marital status: Single    Spouse name: Not on file  . Number of children: Not on file  . Years of education: Not on file  . Highest education level: Not on file  Occupational History  .  Not on file  Social Needs  . Financial resource strain: Not on file  . Food insecurity:    Worry: Not on file    Inability: Not on file  . Transportation needs:    Medical: Not on file    Non-medical: Not on file  Tobacco Use  . Smoking status: Never Smoker  . Smokeless tobacco: Never Used  Substance and Sexual Activity  . Alcohol use: Yes    Comment: occasional  . Drug use: No  . Sexual activity: Not on file  Lifestyle  . Physical activity:    Days per week: Not on file    Minutes per session: Not on file  . Stress: Not on file  Relationships  . Social connections:    Talks on phone: Not on file    Gets together: Not on file    Attends religious service: Not on file    Active member of club or organization: Not on file    Attends meetings of clubs or organizations: Not on file    Relationship status: Not on file  . Intimate partner violence:    Fear of current or ex partner: Not on file    Emotionally abused: Not on file    Physically abused: Not on file    Forced sexual activity: Not on file  Other Topics Concern  . Not on file  Social History Narrative  . Not on file   He is originally from [place], [place].   FAMILY HISTORY: No family history on file.  ALLERGIES:  is allergic to penicillins.  MEDICATIONS:  Current Outpatient Medications  Medication Sig Dispense Refill  . amLODipine (NORVASC) 5 MG tablet Take 1 tablet (5 mg total) by mouth daily. (Patient not taking: Reported on 09/18/2017) 30 tablet 0  . ClonazePAM (KLONOPIN PO) Take 1 mg 3 (three) times daily by mouth.     . fluticasone (FLONASE) 50 MCG/ACT nasal spray Place 1 spray into both nostrils daily.    . methylphenidate (RITALIN) 10 MG tablet Take 10 mg 3 (three) times daily by mouth.     . ondansetron (ZOFRAN ODT) 4 MG disintegrating tablet Take 1 tablet (4 mg total) by mouth every 8 (eight) hours as needed for nausea or vomiting. 20 tablet 0   No current facility-administered medications for  this visit.     REVIEW OF SYSTEMS:   .10 Point review of Systems was done is negative except as noted above.  PHYSICAL EXAMINATION: ECOG PERFORMANCE STATUS: 1 - Symptomatic but completely ambulatory  . Vitals:   11/27/17 0928  BP: (!) 143/66  Pulse: 82  Resp: 18  Temp: 98.1 F (36.7 C)  SpO2: 100%   Filed Weights   11/27/17 0928  Weight: 172 lb 8 oz (78.2 kg)   .Body mass index is 24.06 kg/m. [name] Kitchen GENERAL:alert, in no acute distress and comfortable SKIN: no acute rashes, no significant lesions EYES: conjunctiva are pink and non-injected, sclera anicteric OROPHARYNX: MMM, no exudates, no oropharyngeal erythema or ulceration NECK: supple, no JVD LYMPH:  no  palpable lymphadenopathy in the cervical, axillary or inguinal regions LUNGS: clear to auscultation b/l with normal respiratory effort HEART: regular rate & rhythm ABDOMEN:  normoactive bowel sounds , non tender, spleen just palpable below costal margin. Extremity: no pedal edema PSYCH: alert & oriented x 3 with fluent speech NEURO: no focal motor/sensory deficits    LABORATORY DATA:  I have reviewed the data as listed  . CBC Latest Ref Rng & Units 11/27/2017 10/02/2017 09/25/2017  WBC 4.0 - 10.3 K/uL 5.5 6.0 4.9  Hemoglobin 13.0 - 17.1 g/dL - - -  Hematocrit 38.4 - 49.9 % 37.6(L) 33.8(L) 33.5(L)  Platelets 140 - 400 K/uL 93(L) 104(L) 121(L)  hgb 12.9 . CBC    Component Value Date/Time   WBC 5.5 11/27/2017 0910   WBC 16.6 (H) 09/11/2017 0751   RBC 4.04 (L) 11/27/2017 0910   RBC 4.04 (L) 11/27/2017 0910   HGB 10.8 (L) 09/11/2017 0751   HGB 11.0 (L) 08/31/2017 1101   HCT 37.6 (L) 11/27/2017 0910   HCT 33.8 (L) 08/31/2017 1101   PLT 93 (L) 11/27/2017 0910   PLT 84 (L) 08/31/2017 1101   MCV 93.1 11/27/2017 0910   MCV 91.1 08/31/2017 1101   MCH 31.9 11/27/2017 0910   MCHC 34.3 11/27/2017 0910   RDW 17.8 (H) 11/27/2017 0910   RDW 17.3 (H) 08/31/2017 1101   LYMPHSABS 1.3 11/27/2017 0910   LYMPHSABS 11.4 (H)  08/31/2017 1101   MONOABS 0.6 11/27/2017 0910   MONOABS 0.7 08/31/2017 1101   EOSABS 0.1 11/27/2017 0910   EOSABS 0.1 08/31/2017 1101   BASOSABS 0.0 11/27/2017 0910   BASOSABS 0.0 08/31/2017 1101    . CMP Latest Ref Rng & Units 10/02/2017 09/25/2017 09/18/2017  Glucose 70 - 140 mg/dL 148(H) 166(H) 132  BUN 7 - 26 mg/dL 26 21 20   Creatinine 0.70 - 1.30 mg/dL 0.82 0.86 0.83  Sodium 136 - 145 mmol/L 139 138 138  Potassium 3.5 - 5.1 mmol/L 3.9 3.8 3.9  Chloride 98 - 109 mmol/L 105 103 105  CO2 22 - 29 mmol/L 24 26 25   Calcium 8.4 - 10.4 mg/dL 9.2 9.0 9.2  Total Protein 6.4 - 8.3 g/dL 7.6 7.7 7.5  Total Bilirubin 0.2 - 1.2 mg/dL 0.4 0.4 0.3  Alkaline Phos 40 - 150 U/L 69 76 80  AST 5 - 34 U/L 17 17 24   ALT 0 - 55 U/L 17 19 27      -flow cytometry consistent with marginal zone lymphoma vs splenic Lymphoma      RADIOGRAPHIC STUDIES: I have personally reviewed the radiological images as listed and agreed with the findings in the report. US Abdomen Complete  Result Date: 11/20/2017 CLINICAL DATA:  Splenic marginal zone lymphoma EXAM: ABDOMEN ULTRASOUND COMPLETE COMPARISON:  PET CT 07/04/2017 FINDINGS: Gallbladder: Small stones in the gallbladder measuring up to 5 mm. Normal wall thickness. Negative sonographic Murphy. Common bile duct: Diameter: 4.7 mm Liver: Increased hepatic echogenicity. No focal hepatic abnormality. Portal vein is patent on color Doppler imaging with normal direction of blood flow towards the liver. IVC: No abnormality visualized. Pancreas: Visualized portion unremarkable. Spleen: Enlarged. Spleen measures 14 x 14.4 by 6.8 cm with volume of 719.2 cubic cm. This compares with PET-CT volume of 1100 cubic cm. Right Kidney: Length: 10.9 cm. Echogenicity within normal limits. No hydronephrosis. Cyst in the midpole measuring 2.2 by 1.9 x 1.9 cm. Left Kidney: Length: 12.3 cm. Echogenicity within normal limits. No hydronephrosis. Cyst in the midpole measuring 1.9 x 2.2  by 1.9  cm. Abdominal aorta: No aneurysm visualized. Maximum diameter 2.5 cm proximally Other findings: None. IMPRESSION: 1. Splenomegaly with splenic volume of 719 cubic cm, compared with PET-CT volume of 1100 cubic cm. 2. Cholelithiasis with no sonographic evidence for cholecystitis or biliary dilatation 3. Small cysts in the kidneys 4. Slight increased hepatic echogenicity, possible mild steatosis. Electronically Signed   By: Donavan Foil M.D.   On: 11/20/2017 15:45    ASSESSMENT & PLAN:   Jared Nixon is a  67 y.o. male who presents to our clinic to discuss:  #1: Splenic marginal Zone lymphoma  Initially presented with Splenomegaly with worsening leukocytosis/lymphocytosis, anemia, and thrombocytopenia He completed 4/4 cycles of Rituxan 09/11/17-10/02/17.   US abdomen from 11/20/17 with  1. Splenomegaly with splenic volume of 719 cubic cm, compared with PET-CT volume of 1100 cubic cm. 2. Cholelithiasis with no sonographic evidence for cholecystitis or biliary dilatation 3. Small cysts in the kidneys 4. Slight increased hepatic echogenicity, possible mild steatosis.   Plan:  -We discussed his US abdomen on 11/20/17 s/p Rituxan which showed smaller spleen from 1100 cm^3 to 719 cm^3.  -lab results from today reviewed. PLT count has dropped to 93K, anemia improved. Lymphocytosis has resolved. -Pt notes he had residual side effects from Rituxan including chills at times, tiredness, frequent urination, slight constipation and blood in his nose, joint pain in his hands. These side effects have improved recently.  -Overall he has had good response but not a complete response given splenomegaly still presents and thrombocytopenia.  -I suggest maintenance Rituxan every 2 months for 1-2 years, but he still has the option of observation. Pt is agreeable with maintenance Rituxan and will start in 1 month.    #2 Elevated BP #3 Raynaud's  Syndrome Plan  -Continue amlodipine 5mg  po daily to treat both his  Raynaud's and high BP.  -Encouraged pt to check BP with an arm cuff at home and f/u for BPmx with PCP -I suggest he contact his PCP Dr. Kathryne Eriksson about refill and increasing his amlodipine as his BP has been elevated.  -BP has improved to 143/56 today (11/27/17)  #4 Tinnitus  -I recommend he get further work up with ENT   Starting maintainence Rituxan q60days in 4weeks RTC with Dr Irene Limbo in 4 weeks with labs with start of Maintenance Rituxan   All of the patients questions were answered with apparent satisfaction. The patient knows to call the clinic with any problems, questions or concerns.  . The total time spent in the appointment was 20 minutes and more than 50% was on counseling and direct patient cares.     Sullivan Lone MD [place] AAHIVMS [facility] Hematology/Oncology Physician [facility]  (Office):       [phone] (Work cell):  [phone] (Fax):           [phone]  11/27/2017 9:36 AM   This document serves as a record of services personally performed by Sullivan Lone, MD. It was created on his behalf by Joslyn Devon, a trained medical scribe. The creation of this record is based on the scribe's personal observations and the provider's statements to them.    .I have reviewed the above documentation for accuracy and completeness, and I agree with the above. Brunetta Genera MD MS

## 2017-11-27 ENCOUNTER — Telehealth: Payer: Self-pay | Admitting: Hematology

## 2017-11-27 ENCOUNTER — Inpatient Hospital Stay: Payer: Medicare Other | Attending: Hematology | Admitting: Hematology

## 2017-11-27 ENCOUNTER — Encounter: Payer: Self-pay | Admitting: Hematology

## 2017-11-27 ENCOUNTER — Inpatient Hospital Stay: Payer: Medicare Other

## 2017-11-27 VITALS — BP 143/66 | HR 82 | Temp 98.1°F | Resp 18 | Ht 71.0 in | Wt 172.5 lb

## 2017-11-27 DIAGNOSIS — Z8052 Family history of malignant neoplasm of bladder: Secondary | ICD-10-CM

## 2017-11-27 DIAGNOSIS — D649 Anemia, unspecified: Secondary | ICD-10-CM | POA: Diagnosis not present

## 2017-11-27 DIAGNOSIS — R161 Splenomegaly, not elsewhere classified: Secondary | ICD-10-CM

## 2017-11-27 DIAGNOSIS — Z87891 Personal history of nicotine dependence: Secondary | ICD-10-CM | POA: Insufficient documentation

## 2017-11-27 DIAGNOSIS — Z85828 Personal history of other malignant neoplasm of skin: Secondary | ICD-10-CM

## 2017-11-27 DIAGNOSIS — C8307 Small cell B-cell lymphoma, spleen: Secondary | ICD-10-CM | POA: Diagnosis present

## 2017-11-27 DIAGNOSIS — Z79899 Other long term (current) drug therapy: Secondary | ICD-10-CM | POA: Diagnosis not present

## 2017-11-27 DIAGNOSIS — Z809 Family history of malignant neoplasm, unspecified: Secondary | ICD-10-CM

## 2017-11-27 DIAGNOSIS — I7300 Raynaud's syndrome without gangrene: Secondary | ICD-10-CM | POA: Diagnosis not present

## 2017-11-27 DIAGNOSIS — D696 Thrombocytopenia, unspecified: Secondary | ICD-10-CM | POA: Diagnosis not present

## 2017-11-27 DIAGNOSIS — K8020 Calculus of gallbladder without cholecystitis without obstruction: Secondary | ICD-10-CM | POA: Diagnosis not present

## 2017-11-27 LAB — LACTATE DEHYDROGENASE: LDH: 148 U/L (ref 125–245)

## 2017-11-27 LAB — CMP (CANCER CENTER ONLY)
ALT: 28 U/L (ref 0–55)
AST: 21 U/L (ref 5–34)
Albumin: 4 g/dL (ref 3.5–5.0)
Alkaline Phosphatase: 54 U/L (ref 40–150)
Anion gap: 9 (ref 3–11)
BUN: 25 mg/dL (ref 7–26)
CHLORIDE: 106 mmol/L (ref 98–109)
CO2: 25 mmol/L (ref 22–29)
Calcium: 9.4 mg/dL (ref 8.4–10.4)
Creatinine: 0.86 mg/dL (ref 0.70–1.30)
Glucose, Bld: 128 mg/dL (ref 70–140)
POTASSIUM: 4.4 mmol/L (ref 3.5–5.1)
SODIUM: 140 mmol/L (ref 136–145)
Total Bilirubin: 0.5 mg/dL (ref 0.2–1.2)
Total Protein: 7.7 g/dL (ref 6.4–8.3)

## 2017-11-27 LAB — CBC WITH DIFFERENTIAL (CANCER CENTER ONLY)
Basophils Absolute: 0 10*3/uL (ref 0.0–0.1)
Basophils Relative: 0 %
Eosinophils Absolute: 0.1 10*3/uL (ref 0.0–0.5)
Eosinophils Relative: 1 %
HEMATOCRIT: 37.6 % — AB (ref 38.4–49.9)
Hemoglobin: 12.9 g/dL — ABNORMAL LOW (ref 13.0–17.1)
LYMPHS PCT: 24 %
Lymphs Abs: 1.3 10*3/uL (ref 0.9–3.3)
MCH: 31.9 pg (ref 27.2–33.4)
MCHC: 34.3 g/dL (ref 32.0–36.0)
MCV: 93.1 fL (ref 79.3–98.0)
MONOS PCT: 11 %
Monocytes Absolute: 0.6 10*3/uL (ref 0.1–0.9)
NEUTROS ABS: 3.5 10*3/uL (ref 1.5–6.5)
Neutrophils Relative %: 64 %
Platelet Count: 93 10*3/uL — ABNORMAL LOW (ref 140–400)
RBC: 4.04 MIL/uL — ABNORMAL LOW (ref 4.20–5.82)
RDW: 17.8 % — AB (ref 11.0–14.6)
WBC Count: 5.5 10*3/uL (ref 4.0–10.3)

## 2017-11-27 LAB — RETICULOCYTES
RBC.: 4.04 MIL/uL — ABNORMAL LOW (ref 4.20–5.82)
Retic Count, Absolute: 125.2 10*3/uL — ABNORMAL HIGH (ref 34.8–93.9)
Retic Ct Pct: 3.1 % — ABNORMAL HIGH (ref 0.8–1.8)

## 2017-11-27 NOTE — Telephone Encounter (Signed)
Appointments scheduled AVS/Calendar printed per 4/2 los °

## 2017-12-24 NOTE — Progress Notes (Signed)
HEMATOLOGY/ONCOLOGY CLINIC NOTE  Date of Service: 12/26/17  Patient Care Team: Christain Sacramento, MD as PCP - General (Family Medicine)  CHIEF COMPLAINTS/PURPOSE OF CONSULTATION:  F/u for SMZL  HISTORY OF PRESENTING ILLNESS:  Jared Nixon is a wonderful 67 y.o. male who has been referred to [place] by Dr Redmond Pulling, [name], MD for evaluation and management of new splenomegaly with lymphocytosis.  Initially, the patient presented into his PCP's office on 06/11/17 complaining of a small lump under his left lower ribcage which had been present for several weeks. This was palpable, per his PCP's notes and Dr Redmond Pulling subsequently ordered a CT A/P to evaluate this further. This was performed on 06/13/17 which confirmed the presence of splenomegaly and portal caval lymphadenopathy which was concerning for lymphoproliferative disease. He also had lab work (summarized as below) which also showed some gradual leukocytosis/lymphocytosis, with worsening anemia and thrombocytopenia. He denies any pain over the area of his splenomegaly, difficulty eating or premature satiation.   In a general sense, he had not had an significant chronic health issues which he is being treated for. He does struggle with some tinnitus involving only the left ear which began around the end of July and has somewhat worsened since. One year ago he also had bilateral inguinal hernia surgery and a basal cell carcinoma excision. He also notes in the 80s that he had a gynecomastia removed, but otherwise of recently he has been feeling at his baseline state-of-health. He was initially informed that he suffered from leukocytosis earlier this year by Dr Dois Davenport office.   He did smoke for a brief time several decades ago in his 49s (0.5ppd), but none since. He does drink alcohol, and on average he drinks 7-8 beers a week. He denies any chemical/radiaiton exposures or exposure during his career. He does not partake in illicit drug usage. He has never  previously needed a blood transfusion in the past.   He is currently prescribed Clonazepam and Ritalin to use on a daily basis which are both managed by Christain Sacramento, MD. He takes these for dealing with clutter/hoarding issues ongoing within his personal life.   Of note, the patient reports that his father did have bladder cancer. His paternal Aunts all were diagnosed with cancer, but he is unsure of which types. He does have a h/o CVA on his mothers side, but nothing else of note.   On ROS, he does report a 10lbs weight loss which was observed by Dr Redmond Pulling and was attributed to his usage of Ritalin. He also notes that he does suffer from sleeping issues. He does experience fatigue intermittently, however, this is not debilitating or hindering his daily activities. He denies fever, chills, night sweats, abdominal pain, nausea, vomiting, diarrhea, rash, or any other associated symptoms.   INTERVAL HISTORY   [name] is here for f/u of his SMZL presenting with mild asymptomatic anemia and thrombocytopenia. The patient's last visit with [place] was on 11/27/17. The pt reports that he is doing well overall.  The pt reports that he is ready to begin maintenance Rituxan as was previously discussed.    The pt reports that he has a swollen index finger on his right hand, which has been XR. He has been provided a splint and has not used it, saying it is not comfortable. He denies any injuries to the area and denies other swollen joints. He has been taking Motrin and believes it to be related to his  arthritis. He denies having history of gout.  He notes that he has stopped taking Ritalin 2.5 weeks ago but has continued taking Amlodipine. He notes that stopping Ritalin did help his Raynaud's disease and has not yet let Dr Redmond Pulling know of his medication change.   He notes that he has not been exercising as frequently and notices a slight decrease in his energy levels.   Lab results today (12/26/17) of CBC, CMP,  and Reticulocytes is as follows: all values are WNL except for RBC at 4.02, Hct at 37.2, RDW at 16.1, Platelets at 91k, BUN at 27, Rectic Ct Pct at 3.0%, and Retic Ct Abs at 120.6. LDH 12/26/17 is WNL at 146.   On review of systems, pt reports swollen painful right index finger, decrease in energy, and denies fevers, chills, noticing enlarged lymph nodes, abdominal pains, and any other symptoms.    MEDICAL HISTORY:  Past Medical History:  Diagnosis Date  . Sinus complaint   . Skin cancer     SURGICAL HISTORY: Past Surgical History:  Procedure Laterality Date  . basal cell carcinoma surgery    . CATARACT EXTRACTION      SOCIAL HISTORY: Social History   Socioeconomic History  . Marital status: Single    Spouse name: Not on file  . Number of children: Not on file  . Years of education: Not on file  . Highest education level: Not on file  Occupational History  . Not on file  Social Needs  . Financial resource strain: Not on file  . Food insecurity:    Worry: Not on file    Inability: Not on file  . Transportation needs:    Medical: Not on file    Non-medical: Not on file  Tobacco Use  . Smoking status: Never Smoker  . Smokeless tobacco: Never Used  Substance and Sexual Activity  . Alcohol use: Yes    Comment: occasional  . Drug use: No  . Sexual activity: Not on file  Lifestyle  . Physical activity:    Days per week: Not on file    Minutes per session: Not on file  . Stress: Not on file  Relationships  . Social connections:    Talks on phone: Not on file    Gets together: Not on file    Attends religious service: Not on file    Active member of club or organization: Not on file    Attends meetings of clubs or organizations: Not on file    Relationship status: Not on file  . Intimate partner violence:    Fear of current or ex partner: Not on file    Emotionally abused: Not on file    Physically abused: Not on file    Forced sexual activity: Not on file  Other  Topics Concern  . Not on file  Social History Narrative  . Not on file   He is originally from [place], [place].   FAMILY HISTORY: No family history on file.  ALLERGIES:  is allergic to penicillins.  MEDICATIONS:  Current Outpatient Medications  Medication Sig Dispense Refill  . amLODipine (NORVASC) 5 MG tablet Take 1 tablet (5 mg total) by mouth daily. 30 tablet 0  . ClonazePAM (KLONOPIN PO) Take 1 mg 3 (three) times daily by mouth.     . fluticasone (FLONASE) 50 MCG/ACT nasal spray Place 1 spray into both nostrils daily.    . methylphenidate (RITALIN) 10 MG tablet Take 10 mg 3 (three) times  daily by mouth.     . ondansetron (ZOFRAN ODT) 4 MG disintegrating tablet Take 1 tablet (4 mg total) by mouth every 8 (eight) hours as needed for nausea or vomiting. (Patient not taking: Reported on 11/27/2017) 20 tablet 0   No current facility-administered medications for this visit.     REVIEW OF SYSTEMS:   .10 Point review of Systems was done is negative except as noted above.  PHYSICAL EXAMINATION: ECOG PERFORMANCE STATUS: 1 - Symptomatic but completely ambulatory  VS stable stable. Reviewed in EPIC. [name] Kitchen GENERAL:alert, in no acute distress and comfortable SKIN: no acute rashes, no significant lesions EYES: conjunctiva are pink and non-injected, sclera anicteric OROPHARYNX: MMM, no exudates, no oropharyngeal erythema or ulceration NECK: supple, no JVD LYMPH:  no palpable lymphadenopathy in the cervical, axillary or inguinal regions LUNGS: clear to auscultation b/l with normal respiratory effort HEART: regular rate & rhythm ABDOMEN:  normoactive bowel sounds , non tender, not distended. Extremity: no pedal edema PSYCH: alert & oriented x 3 with fluent speech NEURO: no focal motor/sensory deficits   LABORATORY DATA:  I have reviewed the data as listed  . CBC Latest Ref Rng & Units 12/26/2017 11/27/2017 10/02/2017  WBC 4.0 - 10.3 K/uL 5.3 5.5 6.0  Hemoglobin 13.0 - 17.1 g/dL 13.2  12.9(L) 11.3(L)  Hematocrit 38.4 - 49.9 % 37.2(L) 37.6(L) 33.8(L)  Platelets 140 - 400 K/uL 91(L) 93(L) 104(L)   CBC    Component Value Date/Time   WBC 5.3 12/26/2017 0815   WBC 16.6 (H) 09/11/2017 0751   RBC 4.02 (L) 12/26/2017 0815   RBC 4.02 (L) 12/26/2017 0815   HGB 13.2 12/26/2017 0815   HGB 11.0 (L) 08/31/2017 1101   HCT 37.2 (L) 12/26/2017 0815   HCT 33.8 (L) 08/31/2017 1101   PLT 91 (L) 12/26/2017 0815   PLT 84 (L) 08/31/2017 1101   MCV 92.5 12/26/2017 0815   MCV 91.1 08/31/2017 1101   MCH 32.8 12/26/2017 0815   MCHC 35.5 12/26/2017 0815   RDW 16.1 (H) 12/26/2017 0815   RDW 17.3 (H) 08/31/2017 1101   LYMPHSABS 1.7 12/26/2017 0815   LYMPHSABS 11.4 (H) 08/31/2017 1101   MONOABS 0.6 12/26/2017 0815   MONOABS 0.7 08/31/2017 1101   EOSABS 0.1 12/26/2017 0815   EOSABS 0.1 08/31/2017 1101   BASOSABS 0.0 12/26/2017 0815   BASOSABS 0.0 08/31/2017 1101    . CMP Latest Ref Rng & Units 12/26/2017 11/27/2017 10/02/2017  Glucose 70 - 140 mg/dL 128 128 148(H)  BUN 7 - 26 mg/dL 27(H) 25 26  Creatinine 0.70 - 1.30 mg/dL 0.88 0.86 0.82  Sodium 136 - 145 mmol/L 139 140 139  Potassium 3.5 - 5.1 mmol/L 4.0 4.4 3.9  Chloride 98 - 109 mmol/L 107 106 105  CO2 22 - 29 mmol/L 22 25 24   Calcium 8.4 - 10.4 mg/dL 9.6 9.4 9.2  Total Protein 6.4 - 8.3 g/dL 7.7 7.7 7.6  Total Bilirubin 0.2 - 1.2 mg/dL 0.5 0.5 0.4  Alkaline Phos 40 - 150 U/L 65 54 69  AST 5 - 34 U/L 24 21 17   ALT 0 - 55 U/L 29 28 17      -flow cytometry consistent with marginal zone lymphoma vs splenic Lymphoma      RADIOGRAPHIC STUDIES: I have personally reviewed the radiological images as listed and agreed with the findings in the report. No results found.  ASSESSMENT & PLAN:   NATAN HARTOG is a  67 y.o. male who presents to our clinic  to discuss:  #1: Splenic marginal Zone lymphoma  Initially presented with Splenomegaly with worsening leukocytosis/lymphocytosis, anemia, and thrombocytopenia He completed 4/4  cycles of Rituxan 09/11/17-10/02/17.   US abdomen from 11/20/17 with  1. Splenomegaly with splenic volume of 719 cubic cm, compared with PET-CT volume of 1100 cubic cm. 2. Cholelithiasis with no sonographic evidence for cholecystitis or biliary dilatation 3. Small cysts in the kidneys 4. Slight increased hepatic echogenicity, possible mild steatosis.   Plan:  Discussed pt labwork today 12/26/17; Hgb is improved to 13.2, LDH is WNL at 146, WBC are WNL and lymphocytes have normalized as well. Lower platelets at 91k.  -Recommended to the pt that he walk 20-30 minutes each day and increase his exercising.  -Pt is good to begin maintenance Rituxan every 2 months for 2 years -[place] of spleen in 6 months to evaluate spleen size   #2 Elevated BP #3 Raynaud's  Syndrome Plan  -Continue amlodipine 5mg  po daily to treat both his Raynaud's and high BP.  -Encouraged pt to check BP with an arm cuff at home and f/u for BPmx with PCP -I suggest he contact his PCP Dr. Kathryne Eriksson about refill and increasing his amlodipine as his BP slightly elevated -Recommended that the pt let Dr Redmond Pulling know that he has stopped taking his Ritalin and follow plans per PCP regarding this.  #4 Tinnitus  -I recommend he get further work up with ENT   Plz schedule next 2 doses of maintenance Rituxan q60 days  RTC with Dr Irene Limbo with each treatment with labs q60days   All of the patients questions were answered with apparent satisfaction. The patient knows to call the clinic with any problems, questions or concerns.  . The total time spent in the appointment was 25 minutes and more than 50% was on counseling and direct patient cares.     Sullivan Lone MD [place] AAHIVMS [facility] Hematology/Oncology Physician [facility]  (Office):       [phone]  (Work cell):  [phone] (Fax):           [phone]  12/26/2017 9:04 AM   This document serves as a record of services personally performed by Sullivan Lone, MD.  It was created on his behalf by Baldwin Jamaica, a trained medical scribe. The creation of this record is based on the scribe's personal observations and the provider's statements to them.   .I have reviewed the above documentation for accuracy and completeness, and I agree with the above. Brunetta Genera MD MS

## 2017-12-26 ENCOUNTER — Inpatient Hospital Stay: Payer: Medicare Other

## 2017-12-26 ENCOUNTER — Telehealth: Payer: Self-pay | Admitting: Hematology

## 2017-12-26 ENCOUNTER — Inpatient Hospital Stay: Payer: Medicare Other | Attending: Hematology | Admitting: Hematology

## 2017-12-26 ENCOUNTER — Encounter: Payer: Self-pay | Admitting: Hematology

## 2017-12-26 VITALS — BP 130/63 | HR 70 | Temp 98.4°F | Resp 17

## 2017-12-26 DIAGNOSIS — Z79899 Other long term (current) drug therapy: Secondary | ICD-10-CM | POA: Diagnosis not present

## 2017-12-26 DIAGNOSIS — K8020 Calculus of gallbladder without cholecystitis without obstruction: Secondary | ICD-10-CM

## 2017-12-26 DIAGNOSIS — R161 Splenomegaly, not elsewhere classified: Secondary | ICD-10-CM | POA: Insufficient documentation

## 2017-12-26 DIAGNOSIS — Z85828 Personal history of other malignant neoplasm of skin: Secondary | ICD-10-CM | POA: Diagnosis not present

## 2017-12-26 DIAGNOSIS — I7300 Raynaud's syndrome without gangrene: Secondary | ICD-10-CM | POA: Diagnosis not present

## 2017-12-26 DIAGNOSIS — D696 Thrombocytopenia, unspecified: Secondary | ICD-10-CM | POA: Diagnosis not present

## 2017-12-26 DIAGNOSIS — K4020 Bilateral inguinal hernia, without obstruction or gangrene, not specified as recurrent: Secondary | ICD-10-CM

## 2017-12-26 DIAGNOSIS — C8307 Small cell B-cell lymphoma, spleen: Secondary | ICD-10-CM

## 2017-12-26 DIAGNOSIS — N62 Hypertrophy of breast: Secondary | ICD-10-CM

## 2017-12-26 DIAGNOSIS — D72820 Lymphocytosis (symptomatic): Secondary | ICD-10-CM | POA: Diagnosis not present

## 2017-12-26 DIAGNOSIS — Z8673 Personal history of transient ischemic attack (TIA), and cerebral infarction without residual deficits: Secondary | ICD-10-CM | POA: Diagnosis not present

## 2017-12-26 DIAGNOSIS — Z5111 Encounter for antineoplastic chemotherapy: Secondary | ICD-10-CM

## 2017-12-26 DIAGNOSIS — Z7189 Other specified counseling: Secondary | ICD-10-CM

## 2017-12-26 LAB — CMP (CANCER CENTER ONLY)
ALBUMIN: 4.1 g/dL (ref 3.5–5.0)
ALK PHOS: 65 U/L (ref 40–150)
ALT: 29 U/L (ref 0–55)
AST: 24 U/L (ref 5–34)
Anion gap: 10 (ref 3–11)
BILIRUBIN TOTAL: 0.5 mg/dL (ref 0.2–1.2)
BUN: 27 mg/dL — AB (ref 7–26)
CO2: 22 mmol/L (ref 22–29)
CREATININE: 0.88 mg/dL (ref 0.70–1.30)
Calcium: 9.6 mg/dL (ref 8.4–10.4)
Chloride: 107 mmol/L (ref 98–109)
GFR, Est AFR Am: >60 mL/min (ref >60)
GFR, Estimated: >60 mL/min (ref >60)
GLUCOSE: 128 mg/dL (ref 70–140)
Potassium: 4 mmol/L (ref 3.5–5.1)
SODIUM: 139 mmol/L (ref 136–145)
TOTAL PROTEIN: 7.7 g/dL (ref 6.4–8.3)

## 2017-12-26 LAB — LACTATE DEHYDROGENASE: LDH: 146 U/L (ref 125–245)

## 2017-12-26 LAB — CBC WITH DIFFERENTIAL (CANCER CENTER ONLY)
BASOS PCT: 0 %
Basophils Absolute: 0 10*3/uL (ref 0.0–0.1)
EOS ABS: 0.1 10*3/uL (ref 0.0–0.5)
EOS PCT: 2 %
HEMATOCRIT: 37.2 % — AB (ref 38.4–49.9)
Hemoglobin: 13.2 g/dL (ref 13.0–17.1)
Lymphocytes Relative: 33 %
Lymphs Abs: 1.7 10*3/uL (ref 0.9–3.3)
MCH: 32.8 pg (ref 27.2–33.4)
MCHC: 35.5 g/dL (ref 32.0–36.0)
MCV: 92.5 fL (ref 79.3–98.0)
MONO ABS: 0.6 10*3/uL (ref 0.1–0.9)
MONOS PCT: 11 %
Neutro Abs: 2.9 10*3/uL (ref 1.5–6.5)
Neutrophils Relative %: 54 %
PLATELETS: 91 10*3/uL — AB (ref 140–400)
RBC: 4.02 MIL/uL — ABNORMAL LOW (ref 4.20–5.82)
RDW: 16.1 % — AB (ref 11.0–14.6)
WBC Count: 5.3 10*3/uL (ref 4.0–10.3)

## 2017-12-26 LAB — RETICULOCYTES
RBC.: 4.02 MIL/uL — AB (ref 4.20–5.82)
Retic Count, Absolute: 120.6 10*3/uL — ABNORMAL HIGH (ref 34.8–93.9)
Retic Ct Pct: 3 % — ABNORMAL HIGH (ref 0.8–1.8)

## 2017-12-26 MED ORDER — DIPHENHYDRAMINE HCL 25 MG PO CAPS
50.0000 mg | ORAL_CAPSULE | Freq: Once | ORAL | Status: AC
  Administered 2017-12-26: 50 mg via ORAL

## 2017-12-26 MED ORDER — FAMOTIDINE IN NACL 20-0.9 MG/50ML-% IV SOLN
20.0000 mg | Freq: Once | INTRAVENOUS | Status: AC
  Administered 2017-12-26: 20 mg via INTRAVENOUS

## 2017-12-26 MED ORDER — METHYLPREDNISOLONE SODIUM SUCC 125 MG IJ SOLR
INTRAMUSCULAR | Status: AC
  Filled 2017-12-26: qty 2

## 2017-12-26 MED ORDER — ACETAMINOPHEN 325 MG PO TABS
ORAL_TABLET | ORAL | Status: AC
  Filled 2017-12-26: qty 2

## 2017-12-26 MED ORDER — METHYLPREDNISOLONE SODIUM SUCC 125 MG IJ SOLR
125.0000 mg | Freq: Every day | INTRAMUSCULAR | Status: AC
  Administered 2017-12-26: 125 mg via INTRAVENOUS

## 2017-12-26 MED ORDER — DIPHENHYDRAMINE HCL 25 MG PO CAPS
ORAL_CAPSULE | ORAL | Status: AC
  Filled 2017-12-26: qty 2

## 2017-12-26 MED ORDER — ACETAMINOPHEN 325 MG PO TABS
650.0000 mg | ORAL_TABLET | Freq: Once | ORAL | Status: AC
  Administered 2017-12-26: 650 mg via ORAL

## 2017-12-26 MED ORDER — SODIUM CHLORIDE 0.9 % IV SOLN
375.0000 mg/m2 | Freq: Once | INTRAVENOUS | Status: AC
  Administered 2017-12-26: 700 mg via INTRAVENOUS
  Filled 2017-12-26: qty 20

## 2017-12-26 MED ORDER — FAMOTIDINE IN NACL 20-0.9 MG/50ML-% IV SOLN
INTRAVENOUS | Status: AC
  Filled 2017-12-26: qty 50

## 2017-12-26 MED ORDER — SODIUM CHLORIDE 0.9 % IV SOLN
Freq: Once | INTRAVENOUS | Status: AC
  Administered 2017-12-26: 10:00:00 via INTRAVENOUS

## 2017-12-26 NOTE — Progress Notes (Signed)
Dr. Irene Limbo okay to tx with plt 91.

## 2017-12-26 NOTE — Patient Instructions (Signed)
[facility] Discharge Instructions for Patients Receiving Chemotherapy  Today you received the following chemotherapy agents:  Rituxan.  To help prevent nausea and vomiting after your treatment, we encourage you to take your nausea medication as directed.   If you develop nausea and vomiting that is not controlled by your nausea medication, call the clinic.   BELOW ARE SYMPTOMS THAT SHOULD BE REPORTED IMMEDIATELY:  *FEVER GREATER THAN 100.5 F  *CHILLS WITH OR WITHOUT FEVER  NAUSEA AND VOMITING THAT IS NOT CONTROLLED WITH YOUR NAUSEA MEDICATION  *UNUSUAL SHORTNESS OF BREATH  *UNUSUAL BRUISING OR BLEEDING  TENDERNESS IN MOUTH AND THROAT WITH OR WITHOUT PRESENCE OF ULCERS  *URINARY PROBLEMS  *BOWEL PROBLEMS  UNUSUAL RASH Items with * indicate a potential emergency and should be followed up as soon as possible.  Feel free to call the clinic should you have any questions or concerns. The clinic phone number is [phone].  Please show the CHEMO ALERT CARD at check-in to the Emergency Department and triage nurse.   

## 2017-12-26 NOTE — Telephone Encounter (Signed)
Scheduled appt per 5/1 los - pt is aware of appts no print out wanted .

## 2018-02-14 DIAGNOSIS — M19249 Secondary osteoarthritis, unspecified hand: Secondary | ICD-10-CM | POA: Insufficient documentation

## 2018-02-22 NOTE — Progress Notes (Signed)
HEMATOLOGY/ONCOLOGY CLINIC NOTE  Date of Service: 02/25/18  Patient Care Team: Christain Sacramento, MD as PCP - General (Family Medicine)  CHIEF COMPLAINTS/PURPOSE OF CONSULTATION:  F/u for SMZL  HISTORY OF PRESENTING ILLNESS:  Jared Nixon is a wonderful 67 y.o. male who has been referred to [place] by Dr Redmond Pulling, [name], MD for evaluation and management of new splenomegaly with lymphocytosis.  Initially, the patient presented into his PCP's office on 06/11/17 complaining of a small lump under his left lower ribcage which had been present for several weeks. This was palpable, per his PCP's notes and Dr Redmond Pulling subsequently ordered a CT A/P to evaluate this further. This was performed on 06/13/17 which confirmed the presence of splenomegaly and portal caval lymphadenopathy which was concerning for lymphoproliferative disease. He also had lab work (summarized as below) which also showed some gradual leukocytosis/lymphocytosis, with worsening anemia and thrombocytopenia. He denies any pain over the area of his splenomegaly, difficulty eating or premature satiation.   In a general sense, he had not had an significant chronic health issues which he is being treated for. He does struggle with some tinnitus involving only the left ear which began around the end of July and has somewhat worsened since. One year ago he also had bilateral inguinal hernia surgery and a basal cell carcinoma excision. He also notes in the 80s that he had a gynecomastia removed, but otherwise of recently he has been feeling at his baseline state-of-health. He was initially informed that he suffered from leukocytosis earlier this year by Dr Dois Davenport office.   He did smoke for a brief time several decades ago in his 65s (0.5ppd), but none since. He does drink alcohol, and on average he drinks 7-8 beers a week. He denies any chemical/radiaiton exposures or exposure during his career. He does not partake in illicit drug usage. He has  never previously needed a blood transfusion in the past.   He is currently prescribed Clonazepam and Ritalin to use on a daily basis which are both managed by Christain Sacramento, MD. He takes these for dealing with clutter/hoarding issues ongoing within his personal life.   Of note, the patient reports that his father did have bladder cancer. His paternal Aunts all were diagnosed with cancer, but he is unsure of which types. He does have a h/o CVA on his mothers side, but nothing else of note.   On ROS, he does report a 10lbs weight loss which was observed by Dr Redmond Pulling and was attributed to his usage of Ritalin. He also notes that he does suffer from sleeping issues. He does experience fatigue intermittently, however, this is not debilitating or hindering his daily activities. He denies fever, chills, night sweats, abdominal pain, nausea, vomiting, diarrhea, rash, or any other associated symptoms.   INTERVAL HISTORY   Jared Nixon is here for f/u of his SMZL presenting with mild asymptomatic anemia and thrombocytopenia. The patient's last visit with [place] was on 12/26/17. The pt reports that he is doing well overall.   The pt notes that he has developed some more significant arthritis in his right knee which has settled down some. He denies any red joints and is pursuing further work up for this.   The pt notes that he has begun waking up at night some, is able to go back to sleep afterwards, but feels more fatigued recently.   The pt is tolerating maintenance Rituxan well and notes no concerns receiving it.  Lab results today (02/25/18) of CBC, CMP, and Reticulocytes is as follows: all values are WNL except for RDW at 15.9, PLT at 111k, Retic ct pct at 3.5%, Retic ct abs at 150.2, Glucose at 102. LDH 02/25/18 is WNL at 166  On review of systems, pt reports right knee pain, some fatigue, and denies fevers, chills, night sweats, noticing any enlarged lymph nodes, abdominal pains, leg swelling, abdominal  pains, red joints, recent infections, and any other symptoms.    MEDICAL HISTORY:  Past Medical History:  Diagnosis Date  . Sinus complaint   . Skin cancer     SURGICAL HISTORY: Past Surgical History:  Procedure Laterality Date  . basal cell carcinoma surgery    . CATARACT EXTRACTION      SOCIAL HISTORY: Social History   Socioeconomic History  . Marital status: Single    Spouse name: Not on file  . Number of children: Not on file  . Years of education: Not on file  . Highest education level: Not on file  Occupational History  . Not on file  Social Needs  . Financial resource strain: Not on file  . Food insecurity:    Worry: Not on file    Inability: Not on file  . Transportation needs:    Medical: Not on file    Non-medical: Not on file  Tobacco Use  . Smoking status: Never Smoker  . Smokeless tobacco: Never Used  Substance and Sexual Activity  . Alcohol use: Yes    Comment: occasional  . Drug use: No  . Sexual activity: Not on file  Lifestyle  . Physical activity:    Days per week: Not on file    Minutes per session: Not on file  . Stress: Not on file  Relationships  . Social connections:    Talks on phone: Not on file    Gets together: Not on file    Attends religious service: Not on file    Active member of club or organization: Not on file    Attends meetings of clubs or organizations: Not on file    Relationship status: Not on file  . Intimate partner violence:    Fear of current or ex partner: Not on file    Emotionally abused: Not on file    Physically abused: Not on file    Forced sexual activity: Not on file  Other Topics Concern  . Not on file  Social History Narrative  . Not on file   He is originally from [place], [place].   FAMILY HISTORY: No family history on file.  ALLERGIES:  is allergic to penicillins.  MEDICATIONS:  Current Outpatient Medications  Medication Sig Dispense Refill  . amLODipine (NORVASC) 5 MG tablet Take 1  tablet (5 mg total) by mouth daily. 30 tablet 0  . ClonazePAM (KLONOPIN PO) Take 1 mg by mouth 2 (two) times daily.     . fluticasone (FLONASE) 50 MCG/ACT nasal spray Place 1 spray into both nostrils daily.    . methylphenidate (RITALIN) 10 MG tablet Take 10 mg 3 (three) times daily by mouth.     . ondansetron (ZOFRAN ODT) 4 MG disintegrating tablet Take 1 tablet (4 mg total) by mouth every 8 (eight) hours as needed for nausea or vomiting. (Patient not taking: Reported on 11/27/2017) 20 tablet 0   No current facility-administered medications for this visit.     REVIEW OF SYSTEMS:   A 10+ POINT REVIEW OF SYSTEMS WAS OBTAINED including  neurology, dermatology, psychiatry, cardiac, respiratory, lymph, extremities, GI, GU, Musculoskeletal, constitutional, breasts, reproductive, HEENT.  All pertinent positives are noted in the HPI.  All others are negative.   PHYSICAL EXAMINATION: ECOG PERFORMANCE STATUS: 1 - Symptomatic but completely ambulatory  VS stable stable. Reviewed in EPIC.  GENERAL:alert, in no acute distress and comfortable SKIN: no acute rashes, no significant lesions EYES: conjunctiva are pink and non-injected, sclera anicteric OROPHARYNX: MMM, no exudates, no oropharyngeal erythema or ulceration NECK: supple, no JVD LYMPH:  no palpable lymphadenopathy in the cervical, axillary or inguinal regions LUNGS: clear to auscultation b/l with normal respiratory effort HEART: regular rate & rhythm ABDOMEN:  normoactive bowel sounds , non tender, not distended. Extremity: no pedal edema PSYCH: alert & oriented x 3 with fluent speech NEURO: no focal motor/sensory deficits   LABORATORY DATA:  I have reviewed the data as listed  . CBC Latest Ref Rng & Units 02/25/2018 12/26/2017 11/27/2017  WBC 4.0 - 10.3 K/uL 6.3 5.3 5.5  Hemoglobin 13.0 - 17.1 g/dL 13.9 13.2 12.9(L)  Hematocrit 38.4 - 49.9 % 39.0 37.2(L) 37.6(L)  Platelets 140 - 400 K/uL 111(L) 91(L) 93(L)   CBC    Component Value  Date/Time   WBC 6.3 02/25/2018 1312   WBC 16.6 (H) 09/11/2017 0751   RBC 4.29 02/25/2018 1312   RBC 4.29 02/25/2018 1312   HGB 13.9 02/25/2018 1312   HGB 11.0 (L) 08/31/2017 1101   HCT 39.0 02/25/2018 1312   HCT 33.8 (L) 08/31/2017 1101   PLT 111 (L) 02/25/2018 1312   PLT 84 (L) 08/31/2017 1101   MCV 90.9 02/25/2018 1312   MCV 91.1 08/31/2017 1101   MCH 32.4 02/25/2018 1312   MCHC 35.6 02/25/2018 1312   RDW 15.9 (H) 02/25/2018 1312   RDW 17.3 (H) 08/31/2017 1101   LYMPHSABS 1.5 02/25/2018 1312   LYMPHSABS 11.4 (H) 08/31/2017 1101   MONOABS 0.5 02/25/2018 1312   MONOABS 0.7 08/31/2017 1101   EOSABS 0.1 02/25/2018 1312   EOSABS 0.1 08/31/2017 1101   BASOSABS 0.0 02/25/2018 1312   BASOSABS 0.0 08/31/2017 1101    . CMP Latest Ref Rng & Units 12/26/2017 11/27/2017 10/02/2017  Glucose 70 - 140 mg/dL 128 128 148(H)  BUN 7 - 26 mg/dL 27(H) 25 26  Creatinine 0.70 - 1.30 mg/dL 0.88 0.86 0.82  Sodium 136 - 145 mmol/L 139 140 139  Potassium 3.5 - 5.1 mmol/L 4.0 4.4 3.9  Chloride 98 - 109 mmol/L 107 106 105  CO2 22 - 29 mmol/L 22 25 24   Calcium 8.4 - 10.4 mg/dL 9.6 9.4 9.2  Total Protein 6.4 - 8.3 g/dL 7.7 7.7 7.6  Total Bilirubin 0.2 - 1.2 mg/dL 0.5 0.5 0.4  Alkaline Phos 40 - 150 U/L 65 54 69  AST 5 - 34 U/L 24 21 17   ALT 0 - 55 U/L 29 28 17      -flow cytometry consistent with marginal zone lymphoma vs splenic Lymphoma      RADIOGRAPHIC STUDIES: I have personally reviewed the radiological images as listed and agreed with the findings in the report. No results found.  ASSESSMENT & PLAN:   VALIANT DILLS is a  67 y.o. male who presents to our clinic to discuss:  #1: Splenic marginal Zone lymphoma  Initially presented with Splenomegaly with worsening leukocytosis/lymphocytosis, anemia, and thrombocytopenia He completed 4/4 cycles of Rituxan 09/11/17-10/02/17.   US abdomen from 11/20/17 with  1. Splenomegaly with splenic volume of 719 cubic cm, compared with PET-CT volume  of  1100 cubic cm. 2. Cholelithiasis with no sonographic evidence for cholecystitis or biliary dilatation 3. Small cysts in the kidneys 4. Slight increased hepatic echogenicity, possible mild steatosis.   Plan:   -Discussed pt labwork today, 02/25/18; HGB has normalized to 13.9, PLT improved to 111k, WBC are normal -Discussed the recommendation to continue maintenance Rituxan for a maximum of 2 years unless the pt developed any new intolerances -The pt has no prohibitive toxicities from continuing maintenance Rituxan at this time.  -Recommended to the pt that he walk 20-30 minutes each day and increase his exercising.  -[place] of spleen in 6 months to evaluate spleen size   #2 Elevated BP #3 Raynaud's  Syndrome Plan  -Continue amlodipine 5mg  po daily to treat both his Raynaud's and high BP.  -Encouraged pt to check BP with an arm cuff at home and f/u for BPmx with PCP -I suggest he contact his PCP Dr. Kathryne Eriksson about refill and increasing his amlodipine as his BP slightly elevated -Recommended that the pt let Dr Redmond Pulling know that he has stopped taking his Ritalin and follow plans per PCP regarding this.  #4 Tinnitus  -I recommend he get further work up with ENT   Plz schedule next 2 doses of maintenance Rituxan q60 days  RTC with Dr Irene Limbo with each treatment with labs q60days    All of the patients questions were answered with apparent satisfaction. The patient knows to call the clinic with any problems, questions or concerns.  The total time spent in the appt was 20 minutes and more than 50% was on counseling and direct patient cares.      Sullivan Lone MD MS AAHIVMS [facility] Hematology/Oncology Physician [facility]  (Office):       [phone]  (Work cell):  [phone] (Fax):           [phone]  02/25/2018 1:51 PM   I, Baldwin Jamaica, am acting as a Education administrator for Dr Irene Limbo.   .I have reviewed the above documentation for accuracy and completeness, and I agree with  the above. Brunetta Genera MD

## 2018-02-25 ENCOUNTER — Encounter: Payer: Self-pay | Admitting: Hematology

## 2018-02-25 ENCOUNTER — Inpatient Hospital Stay: Payer: Medicare Other | Attending: Hematology | Admitting: Hematology

## 2018-02-25 ENCOUNTER — Inpatient Hospital Stay: Payer: Medicare Other

## 2018-02-25 VITALS — BP 119/71 | HR 72 | Temp 97.8°F | Resp 17

## 2018-02-25 VITALS — BP 152/82 | HR 92 | Temp 98.3°F | Resp 18 | Ht 71.0 in | Wt 176.1 lb

## 2018-02-25 DIAGNOSIS — C8307 Small cell B-cell lymphoma, spleen: Secondary | ICD-10-CM

## 2018-02-25 DIAGNOSIS — R634 Abnormal weight loss: Secondary | ICD-10-CM | POA: Diagnosis not present

## 2018-02-25 DIAGNOSIS — R5383 Other fatigue: Secondary | ICD-10-CM | POA: Diagnosis not present

## 2018-02-25 DIAGNOSIS — Z8052 Family history of malignant neoplasm of bladder: Secondary | ICD-10-CM | POA: Diagnosis not present

## 2018-02-25 DIAGNOSIS — Z87891 Personal history of nicotine dependence: Secondary | ICD-10-CM | POA: Insufficient documentation

## 2018-02-25 DIAGNOSIS — Z79899 Other long term (current) drug therapy: Secondary | ICD-10-CM | POA: Diagnosis not present

## 2018-02-25 DIAGNOSIS — K8020 Calculus of gallbladder without cholecystitis without obstruction: Secondary | ICD-10-CM | POA: Diagnosis not present

## 2018-02-25 DIAGNOSIS — Z85828 Personal history of other malignant neoplasm of skin: Secondary | ICD-10-CM | POA: Diagnosis not present

## 2018-02-25 DIAGNOSIS — M25561 Pain in right knee: Secondary | ICD-10-CM | POA: Diagnosis not present

## 2018-02-25 DIAGNOSIS — R161 Splenomegaly, not elsewhere classified: Secondary | ICD-10-CM

## 2018-02-25 DIAGNOSIS — N281 Cyst of kidney, acquired: Secondary | ICD-10-CM | POA: Diagnosis not present

## 2018-02-25 DIAGNOSIS — Z809 Family history of malignant neoplasm, unspecified: Secondary | ICD-10-CM | POA: Diagnosis not present

## 2018-02-25 DIAGNOSIS — I7300 Raynaud's syndrome without gangrene: Secondary | ICD-10-CM

## 2018-02-25 DIAGNOSIS — I10 Essential (primary) hypertension: Secondary | ICD-10-CM

## 2018-02-25 DIAGNOSIS — Z5112 Encounter for antineoplastic immunotherapy: Secondary | ICD-10-CM | POA: Diagnosis present

## 2018-02-25 DIAGNOSIS — Z7189 Other specified counseling: Secondary | ICD-10-CM

## 2018-02-25 DIAGNOSIS — D696 Thrombocytopenia, unspecified: Secondary | ICD-10-CM

## 2018-02-25 LAB — CMP (CANCER CENTER ONLY)
ALK PHOS: 67 U/L (ref 38–126)
ALT: 31 U/L (ref 0–44)
AST: 22 U/L (ref 15–41)
Albumin: 4.4 g/dL (ref 3.5–5.0)
Anion gap: 7 (ref 5–15)
BUN: 18 mg/dL (ref 8–23)
CHLORIDE: 107 mmol/L (ref 98–111)
CO2: 27 mmol/L (ref 22–32)
CREATININE: 0.85 mg/dL (ref 0.61–1.24)
Calcium: 9.7 mg/dL (ref 8.9–10.3)
GFR, Est AFR Am: >60 mL/min (ref >60)
Glucose, Bld: 102 mg/dL — ABNORMAL HIGH (ref 70–99)
Potassium: 3.9 mmol/L (ref 3.5–5.1)
Sodium: 141 mmol/L (ref 135–145)
Total Bilirubin: 0.7 mg/dL (ref 0.3–1.2)
Total Protein: 8 g/dL (ref 6.5–8.1)

## 2018-02-25 LAB — CBC WITH DIFFERENTIAL (CANCER CENTER ONLY)
BASOS ABS: 0 10*3/uL (ref 0.0–0.1)
Basophils Relative: 1 %
EOS PCT: 1 %
Eosinophils Absolute: 0.1 10*3/uL (ref 0.0–0.5)
HCT: 39 % (ref 38.4–49.9)
Hemoglobin: 13.9 g/dL (ref 13.0–17.1)
LYMPHS ABS: 1.5 10*3/uL (ref 0.9–3.3)
LYMPHS PCT: 24 %
MCH: 32.4 pg (ref 27.2–33.4)
MCHC: 35.6 g/dL (ref 32.0–36.0)
MCV: 90.9 fL (ref 79.3–98.0)
MONO ABS: 0.5 10*3/uL (ref 0.1–0.9)
Monocytes Relative: 8 %
Neutro Abs: 4.2 10*3/uL (ref 1.5–6.5)
Neutrophils Relative %: 66 %
PLATELETS: 111 10*3/uL — AB (ref 140–400)
RBC: 4.29 MIL/uL (ref 4.20–5.82)
RDW: 15.9 % — AB (ref 11.0–14.6)
WBC Count: 6.3 10*3/uL (ref 4.0–10.3)

## 2018-02-25 LAB — LACTATE DEHYDROGENASE: LDH: 166 U/L (ref 98–192)

## 2018-02-25 LAB — RETICULOCYTES
RBC.: 4.29 MIL/uL (ref 4.20–5.82)
Retic Count, Absolute: 150.2 10*3/uL — ABNORMAL HIGH (ref 34.8–93.9)
Retic Ct Pct: 3.5 % — ABNORMAL HIGH (ref 0.8–1.8)

## 2018-02-25 MED ORDER — ACETAMINOPHEN 325 MG PO TABS
ORAL_TABLET | ORAL | Status: AC
  Filled 2018-02-25: qty 2

## 2018-02-25 MED ORDER — RITUXIMAB CHEMO INJECTION 500 MG/50ML
375.0000 mg/m2 | Freq: Once | INTRAVENOUS | Status: AC
  Administered 2018-02-25: 700 mg via INTRAVENOUS
  Filled 2018-02-25: qty 50

## 2018-02-25 MED ORDER — SODIUM CHLORIDE 0.9 % IV SOLN
Freq: Once | INTRAVENOUS | Status: AC
  Administered 2018-02-25: 15:00:00 via INTRAVENOUS

## 2018-02-25 MED ORDER — FAMOTIDINE IN NACL 20-0.9 MG/50ML-% IV SOLN
INTRAVENOUS | Status: AC
  Filled 2018-02-25: qty 50

## 2018-02-25 MED ORDER — DIPHENHYDRAMINE HCL 25 MG PO CAPS
ORAL_CAPSULE | ORAL | Status: AC
  Filled 2018-02-25: qty 2

## 2018-02-25 MED ORDER — METHYLPREDNISOLONE SODIUM SUCC 125 MG IJ SOLR
125.0000 mg | Freq: Every day | INTRAMUSCULAR | Status: AC
  Administered 2018-02-25: 125 mg via INTRAVENOUS

## 2018-02-25 MED ORDER — ACETAMINOPHEN 325 MG PO TABS
650.0000 mg | ORAL_TABLET | Freq: Once | ORAL | Status: AC
  Administered 2018-02-25: 650 mg via ORAL

## 2018-02-25 MED ORDER — DIPHENHYDRAMINE HCL 25 MG PO CAPS
50.0000 mg | ORAL_CAPSULE | Freq: Once | ORAL | Status: AC
  Administered 2018-02-25: 50 mg via ORAL

## 2018-02-25 MED ORDER — FAMOTIDINE IN NACL 20-0.9 MG/50ML-% IV SOLN
20.0000 mg | Freq: Once | INTRAVENOUS | Status: AC
  Administered 2018-02-25: 20 mg via INTRAVENOUS

## 2018-02-25 MED ORDER — METHYLPREDNISOLONE SODIUM SUCC 125 MG IJ SOLR
INTRAMUSCULAR | Status: AC
Start: 2018-02-25 — End: ?
  Filled 2018-02-25: qty 2

## 2018-02-25 NOTE — Patient Instructions (Signed)
[facility] Discharge Instructions for Patients Receiving Chemotherapy  Today you received the following chemotherapy agents Rituximab (Rituxan).  To help prevent nausea and vomiting after your treatment, we encourage you to take your nausea medication as prescribed.   If you develop nausea and vomiting that is not controlled by your nausea medication, call the clinic.   BELOW ARE SYMPTOMS THAT SHOULD BE REPORTED IMMEDIATELY:  *FEVER GREATER THAN 100.5 F  *CHILLS WITH OR WITHOUT FEVER  NAUSEA AND VOMITING THAT IS NOT CONTROLLED WITH YOUR NAUSEA MEDICATION  *UNUSUAL SHORTNESS OF BREATH  *UNUSUAL BRUISING OR BLEEDING  TENDERNESS IN MOUTH AND THROAT WITH OR WITHOUT PRESENCE OF ULCERS  *URINARY PROBLEMS  *BOWEL PROBLEMS  UNUSUAL RASH Items with * indicate a potential emergency and should be followed up as soon as possible.  Feel free to call the clinic should you have any questions or concerns. The clinic phone number is [phone].  Please show the CHEMO ALERT CARD at check-in to the Emergency Department and triage nurse.   

## 2018-03-19 DIAGNOSIS — M179 Osteoarthritis of knee, unspecified: Secondary | ICD-10-CM | POA: Insufficient documentation

## 2018-03-19 DIAGNOSIS — M1710 Unilateral primary osteoarthritis, unspecified knee: Secondary | ICD-10-CM | POA: Insufficient documentation

## 2018-04-04 ENCOUNTER — Other Ambulatory Visit (HOSPITAL_COMMUNITY): Payer: Self-pay | Admitting: Family Medicine

## 2018-04-04 ENCOUNTER — Ambulatory Visit (HOSPITAL_COMMUNITY)
Admission: RE | Admit: 2018-04-04 | Discharge: 2018-04-04 | Disposition: A | Discharge status: Home / Self Care | Payer: Medicare Other | Source: Ambulatory Visit | Attending: Family Medicine | Admitting: Family Medicine

## 2018-04-04 DIAGNOSIS — R600 Localized edema: Secondary | ICD-10-CM | POA: Diagnosis present

## 2018-04-04 DIAGNOSIS — M79604 Pain in right leg: Secondary | ICD-10-CM

## 2018-04-04 DIAGNOSIS — M7989 Other specified soft tissue disorders: Principal | ICD-10-CM

## 2018-04-04 NOTE — Progress Notes (Signed)
Right lower extremity venous duplex has been completed. Negative for DVT. Results were given to [facility] at Dr. Dois Davenport office.  04/04/18 1:49 PM Jared Nixon RVT

## 2018-04-26 NOTE — Progress Notes (Signed)
HEMATOLOGY/ONCOLOGY CLINIC NOTE  Date of Service:  04/30/18    Patient Care Team: Christain Sacramento, MD as PCP - General (Family Medicine)  CHIEF COMPLAINTS/PURPOSE OF CONSULTATION:  F/u for SMZL  HISTORY OF PRESENTING ILLNESS:  Jared Nixon is a wonderful 67 y.o. male who has been referred to [place] by Dr Redmond Pulling, [name], MD for evaluation and management of new splenomegaly with lymphocytosis.  Initially, the patient presented into his PCP's office on 06/11/17 complaining of a small lump under his left lower ribcage which had been present for several weeks. This was palpable, per his PCP's notes and Dr Redmond Pulling subsequently ordered a CT A/P to evaluate this further. This was performed on 06/13/17 which confirmed the presence of splenomegaly and portal caval lymphadenopathy which was concerning for lymphoproliferative disease. He also had lab work (summarized as below) which also showed some gradual leukocytosis/lymphocytosis, with worsening anemia and thrombocytopenia. He denies any pain over the area of his splenomegaly, difficulty eating or premature satiation.   In a general sense, he had not had an significant chronic health issues which he is being treated for. He does struggle with some tinnitus involving only the left ear which began around the end of July and has somewhat worsened since. One year ago he also had bilateral inguinal hernia surgery and a basal cell carcinoma excision. He also notes in the 80s that he had a gynecomastia removed, but otherwise of recently he has been feeling at his baseline state-of-health. He was initially informed that he suffered from leukocytosis earlier this year by Dr Dois Davenport office.   He did smoke for a brief time several decades ago in his 16s (0.5ppd), but none since. He does drink alcohol, and on average he drinks 7-8 beers a week. He denies any chemical/radiaiton exposures or exposure during his career. He does not partake in illicit drug usage. He has  never previously needed a blood transfusion in the past.   He is currently prescribed Clonazepam and Ritalin to use on a daily basis which are both managed by Christain Sacramento, MD. He takes these for dealing with clutter/hoarding issues ongoing within his personal life.   Of note, the patient reports that his father did have bladder cancer. His paternal Aunts all were diagnosed with cancer, but he is unsure of which types. He does have a h/o CVA on his mothers side, but nothing else of note.   On ROS, he does report a 10lbs weight loss which was observed by Dr Redmond Pulling and was attributed to his usage of Ritalin. He also notes that he does suffer from sleeping issues. He does experience fatigue intermittently, however, this is not debilitating or hindering his daily activities. He denies fever, chills, night sweats, abdominal pain, nausea, vomiting, diarrhea, rash, or any other associated symptoms.   INTERVAL HISTORY   Jared Nixon is here for f/u of his SMZL presenting with mild asymptomatic anemia and thrombocytopenia. The patient's last visit with [place] was on 02/25/18. The pt reports that he is doing well overall.   The pt reports that his right leg has been slightly swollen recently, which he discussed with his PCP and the subsequent VAS [place] was negative for a blood clot. He notes that his leg swelling has resolved.   The pt notes that his energy levels have been stable, he continues to eat well, though is not remaining as active as he used to. He denies the development of any new constitutional symptoms  in the interim and has no other new concerns at this time.   Lab results today (04/30/18) of CBC w/diff, CMP, and Reticulocytes is as follows: all values are WNL except for RDW at 15.7, PLT at 128k, Retic ct pct at 3.3%, Retic ct abs at 140.9k, Glucose at 118. 04/30/18 LDH is . Lab Results  Component Value Date   LDH 147 04/30/2018    On review of systems, pt reports eating well, stable energy levels,  weight gain, resolved right leg swelling, and denies fevers, chills, night sweats, unexpected weight loss, abdominal pains, ankle swelling, recent infections, and any other symptoms.   MEDICAL HISTORY:  Past Medical History:  Diagnosis Date  . Sinus complaint   . Skin cancer     SURGICAL HISTORY: Past Surgical History:  Procedure Laterality Date  . basal cell carcinoma surgery    . CATARACT EXTRACTION      SOCIAL HISTORY: Social History   Socioeconomic History  . Marital status: Single    Spouse name: Not on file  . Number of children: Not on file  . Years of education: Not on file  . Highest education level: Not on file  Occupational History  . Not on file  Social Needs  . Financial resource strain: Not on file  . Food insecurity:    Worry: Not on file    Inability: Not on file  . Transportation needs:    Medical: Not on file    Non-medical: Not on file  Tobacco Use  . Smoking status: Never Smoker  . Smokeless tobacco: Never Used  Substance and Sexual Activity  . Alcohol use: Yes    Comment: occasional  . Drug use: No  . Sexual activity: Not on file  Lifestyle  . Physical activity:    Days per week: Not on file    Minutes per session: Not on file  . Stress: Not on file  Relationships  . Social connections:    Talks on phone: Not on file    Gets together: Not on file    Attends religious service: Not on file    Active member of club or organization: Not on file    Attends meetings of clubs or organizations: Not on file    Relationship status: Not on file  . Intimate partner violence:    Fear of current or ex partner: Not on file    Emotionally abused: Not on file    Physically abused: Not on file    Forced sexual activity: Not on file  Other Topics Concern  . Not on file  Social History Narrative  . Not on file   He is originally from [place], [place].   FAMILY HISTORY: No family history on file.  ALLERGIES:  is allergic to  penicillins.  MEDICATIONS:  Current Outpatient Medications  Medication Sig Dispense Refill  . amLODipine (NORVASC) 5 MG tablet Take 1 tablet (5 mg total) by mouth daily. 30 tablet 0  . ClonazePAM (KLONOPIN PO) Take 1 mg by mouth 2 (two) times daily.     . diclofenac (VOLTAREN) 25 MG EC tablet Take by mouth 2 (two) times daily.    . fluticasone (FLONASE) 50 MCG/ACT nasal spray Place 1 spray into both nostrils daily.    . methylphenidate (RITALIN) 10 MG tablet Take 10 mg 3 (three) times daily by mouth.     . ondansetron (ZOFRAN ODT) 4 MG disintegrating tablet Take 1 tablet (4 mg total) by mouth every 8 (eight) hours  as needed for nausea or vomiting. (Patient not taking: Reported on 11/27/2017) 20 tablet 0   No current facility-administered medications for this visit.     REVIEW OF SYSTEMS:   A 10+ POINT REVIEW OF SYSTEMS WAS OBTAINED including neurology, dermatology, psychiatry, cardiac, respiratory, lymph, extremities, GI, GU, Musculoskeletal, constitutional, breasts, reproductive, HEENT.  All pertinent positives are noted in the HPI.  All others are negative.   PHYSICAL EXAMINATION: ECOG PERFORMANCE STATUS: 1 - Symptomatic but completely ambulatory  VS stable stable. Reviewed in EPIC.  GENERAL:alert, in no acute distress and comfortable SKIN: no acute rashes, no significant lesions EYES: conjunctiva are pink and non-injected, sclera anicteric OROPHARYNX: MMM, no exudates, no oropharyngeal erythema or ulceration NECK: supple, no JVD LYMPH:  no palpable lymphadenopathy in the cervical, axillary or inguinal regions LUNGS: clear to auscultation b/l with normal respiratory effort HEART: regular rate & rhythm ABDOMEN:  normoactive bowel sounds , non tender, not distended. No palpable hepatosplenomegaly.  Extremity: no pedal edema PSYCH: alert & oriented x 3 with fluent speech NEURO: no focal motor/sensory deficits   LABORATORY DATA:  I have reviewed the data as listed  . CBC Latest  Ref Rng & Units 04/30/2018 02/25/2018 12/26/2017  WBC 4.0 - 10.3 K/uL 6.7 6.3 5.3  Hemoglobin 13.0 - 17.1 g/dL 13.5 13.9 13.2  Hematocrit 38.4 - 49.9 % 38.9 39.0 37.2(L)  Platelets 140 - 400 K/uL 128(L) 111(L) 91(L)   CBC    Component Value Date/Time   WBC 6.7 04/30/2018 1106   RBC 4.27 04/30/2018 1106   RBC 4.27 04/30/2018 1106   HGB 13.5 04/30/2018 1106   HGB 13.9 02/25/2018 1312   HGB 11.0 (L) 08/31/2017 1101   HCT 38.9 04/30/2018 1106   HCT 33.8 (L) 08/31/2017 1101   PLT 128 (L) 04/30/2018 1106   PLT 111 (L) 02/25/2018 1312   PLT 84 (L) 08/31/2017 1101   MCV 91.1 04/30/2018 1106   MCV 91.1 08/31/2017 1101   MCH 31.6 04/30/2018 1106   MCHC 34.7 04/30/2018 1106   RDW 15.7 (H) 04/30/2018 1106   RDW 17.3 (H) 08/31/2017 1101   LYMPHSABS 1.7 04/30/2018 1106   LYMPHSABS 11.4 (H) 08/31/2017 1101   MONOABS 0.5 04/30/2018 1106   MONOABS 0.7 08/31/2017 1101   EOSABS 0.1 04/30/2018 1106   EOSABS 0.1 08/31/2017 1101   BASOSABS 0.0 04/30/2018 1106   BASOSABS 0.0 08/31/2017 1101    . CMP Latest Ref Rng & Units 04/30/2018 02/25/2018 12/26/2017  Glucose 70 - 99 mg/dL 118(H) 102(H) 128  BUN 8 - 23 mg/dL 23 18 27(H)  Creatinine 0.61 - 1.24 mg/dL 0.90 0.85 0.88  Sodium 135 - 145 mmol/L 139 141 139  Potassium 3.5 - 5.1 mmol/L 4.1 3.9 4.0  Chloride 98 - 111 mmol/L 104 107 107  CO2 22 - 32 mmol/L 26 27 22   Calcium 8.9 - 10.3 mg/dL 9.8 9.7 9.6  Total Protein 6.5 - 8.1 g/dL 8.0 8.0 7.7  Total Bilirubin 0.3 - 1.2 mg/dL 0.7 0.7 0.5  Alkaline Phos 38 - 126 U/L 69 67 65  AST 15 - 41 U/L 21 22 24   ALT 0 - 44 U/L 26 31 29      -flow cytometry consistent with marginal zone lymphoma vs splenic Lymphoma      RADIOGRAPHIC STUDIES: I have personally reviewed the radiological images as listed and agreed with the findings in the report. No results found.  ASSESSMENT & PLAN:   Jared Nixon is a  67 y.o. male  who presents to our clinic to discuss:  #1: Splenic marginal Zone lymphoma  Initially  presented with Splenomegaly with worsening leukocytosis/lymphocytosis, anemia, and thrombocytopenia He completed 4/4 cycles of Rituxan 09/11/17-10/02/17.   US abdomen from 11/20/17 with  1. Splenomegaly with splenic volume of 719 cubic cm, compared with PET-CT volume of 1100 cubic cm. 2. Cholelithiasis with no sonographic evidence for cholecystitis or biliary dilatation 3. Small cysts in the kidneys 4. Slight increased hepatic echogenicity, possible mild steatosis.   Plan:  -Discussed the recommendation to continue maintenance Rituxan for a maximum of 2 years unless the pt developed any new intolerances -Discussed pt labwork today, 04/30/18; blood counts and chemistries are stable -The pt has no prohibitive toxicities from continuing with his third dose of maintenance Rituxan infusion at this time.    -The pt shows no clinical or lab progression of SMZL at this time.  -Will see the pt back in 2 months, sooner if any new concerns   #2 Elevated BP #3 Raynaud's  Syndrome Plan  -Continue amlodipine 5mg  po daily to treat both his Raynaud's and high BP.  -Encouraged pt to check BP with an arm cuff at home and f/u for BPmx with PCP   RTC for next dose of Maintenance Rituxan in 60 days with labs/MD visit.   All of the patients questions were answered with apparent satisfaction. The patient knows to call the clinic with any problems, questions or concerns.  The total time spent in the appt was 25 minutes and more than 50% was on counseling and direct patient cares.     Sullivan Lone MD MS AAHIVMS [facility] Hematology/Oncology Physician [facility]  (Office):       [phone]  (Work cell):  [phone] (Fax):           [phone]  04/30/2018 12:03 PM   I, Baldwin Jamaica, am acting as a scribe for Dr. Irene Limbo  .I have reviewed the above documentation for accuracy and completeness, and I agree with the above. Brunetta Genera MD

## 2018-04-30 ENCOUNTER — Inpatient Hospital Stay (HOSPITAL_BASED_OUTPATIENT_CLINIC_OR_DEPARTMENT_OTHER): Payer: Medicare Other | Admitting: Hematology

## 2018-04-30 ENCOUNTER — Other Ambulatory Visit: Payer: Medicare Other

## 2018-04-30 ENCOUNTER — Ambulatory Visit: Payer: Medicare Other

## 2018-04-30 ENCOUNTER — Ambulatory Visit: Payer: Medicare Other | Admitting: Hematology

## 2018-04-30 ENCOUNTER — Inpatient Hospital Stay: Payer: Medicare Other | Attending: Hematology

## 2018-04-30 ENCOUNTER — Inpatient Hospital Stay: Payer: Medicare Other

## 2018-04-30 VITALS — BP 155/80 | HR 93 | Temp 98.4°F | Resp 18 | Ht 71.0 in | Wt 177.2 lb

## 2018-04-30 VITALS — BP 133/70 | HR 78 | Temp 97.8°F | Resp 18

## 2018-04-30 DIAGNOSIS — D649 Anemia, unspecified: Secondary | ICD-10-CM

## 2018-04-30 DIAGNOSIS — Z8052 Family history of malignant neoplasm of bladder: Secondary | ICD-10-CM | POA: Insufficient documentation

## 2018-04-30 DIAGNOSIS — K4020 Bilateral inguinal hernia, without obstruction or gangrene, not specified as recurrent: Secondary | ICD-10-CM | POA: Insufficient documentation

## 2018-04-30 DIAGNOSIS — M7989 Other specified soft tissue disorders: Secondary | ICD-10-CM | POA: Insufficient documentation

## 2018-04-30 DIAGNOSIS — Z8673 Personal history of transient ischemic attack (TIA), and cerebral infarction without residual deficits: Secondary | ICD-10-CM

## 2018-04-30 DIAGNOSIS — Z85828 Personal history of other malignant neoplasm of skin: Secondary | ICD-10-CM | POA: Diagnosis not present

## 2018-04-30 DIAGNOSIS — I7300 Raynaud's syndrome without gangrene: Secondary | ICD-10-CM | POA: Insufficient documentation

## 2018-04-30 DIAGNOSIS — Z7189 Other specified counseling: Secondary | ICD-10-CM

## 2018-04-30 DIAGNOSIS — C8307 Small cell B-cell lymphoma, spleen: Secondary | ICD-10-CM

## 2018-04-30 DIAGNOSIS — Z5112 Encounter for antineoplastic immunotherapy: Secondary | ICD-10-CM | POA: Insufficient documentation

## 2018-04-30 DIAGNOSIS — D696 Thrombocytopenia, unspecified: Secondary | ICD-10-CM | POA: Insufficient documentation

## 2018-04-30 DIAGNOSIS — Z79899 Other long term (current) drug therapy: Secondary | ICD-10-CM | POA: Insufficient documentation

## 2018-04-30 DIAGNOSIS — R030 Elevated blood-pressure reading, without diagnosis of hypertension: Secondary | ICD-10-CM | POA: Insufficient documentation

## 2018-04-30 LAB — CBC WITH DIFFERENTIAL/PLATELET
Basophils Absolute: 0 10*3/uL (ref 0.0–0.1)
Basophils Relative: 0 %
Eosinophils Absolute: 0.1 10*3/uL (ref 0.0–0.5)
Eosinophils Relative: 1 %
HCT: 38.9 % (ref 38.4–49.9)
Hemoglobin: 13.5 g/dL (ref 13.0–17.1)
Lymphocytes Relative: 25 %
Lymphs Abs: 1.7 10*3/uL (ref 0.9–3.3)
MCH: 31.6 pg (ref 27.2–33.4)
MCHC: 34.7 g/dL (ref 32.0–36.0)
MCV: 91.1 fL (ref 79.3–98.0)
Monocytes Absolute: 0.5 10*3/uL (ref 0.1–0.9)
Monocytes Relative: 7 %
Neutro Abs: 4.4 10*3/uL (ref 1.5–6.5)
Neutrophils Relative %: 67 %
Platelets: 128 10*3/uL — ABNORMAL LOW (ref 140–400)
RBC: 4.27 MIL/uL (ref 4.20–5.82)
RDW: 15.7 % — ABNORMAL HIGH (ref 11.0–14.6)
WBC: 6.7 10*3/uL (ref 4.0–10.3)

## 2018-04-30 LAB — RETICULOCYTES
RBC.: 4.27 MIL/uL (ref 4.20–5.82)
RETIC COUNT ABSOLUTE: 140.9 10*3/uL — AB (ref 34.8–93.9)
Retic Ct Pct: 3.3 % — ABNORMAL HIGH (ref 0.8–1.8)

## 2018-04-30 LAB — CMP (CANCER CENTER ONLY)
ALBUMIN: 4.2 g/dL (ref 3.5–5.0)
ALT: 26 U/L (ref 0–44)
AST: 21 U/L (ref 15–41)
Alkaline Phosphatase: 69 U/L (ref 38–126)
Anion gap: 9 (ref 5–15)
BILIRUBIN TOTAL: 0.7 mg/dL (ref 0.3–1.2)
BUN: 23 mg/dL (ref 8–23)
CO2: 26 mmol/L (ref 22–32)
Calcium: 9.8 mg/dL (ref 8.9–10.3)
Chloride: 104 mmol/L (ref 98–111)
Creatinine: 0.9 mg/dL (ref 0.61–1.24)
GFR, Est AFR Am: >60 mL/min (ref >60)
GLUCOSE: 118 mg/dL — AB (ref 70–99)
POTASSIUM: 4.1 mmol/L (ref 3.5–5.1)
Sodium: 139 mmol/L (ref 135–145)
TOTAL PROTEIN: 8 g/dL (ref 6.5–8.1)

## 2018-04-30 LAB — LACTATE DEHYDROGENASE: LDH: 147 U/L (ref 98–192)

## 2018-04-30 MED ORDER — DIPHENHYDRAMINE HCL 25 MG PO CAPS
ORAL_CAPSULE | ORAL | Status: AC
  Filled 2018-04-30: qty 2

## 2018-04-30 MED ORDER — FAMOTIDINE IN NACL 20-0.9 MG/50ML-% IV SOLN
INTRAVENOUS | Status: AC
  Filled 2018-04-30: qty 50

## 2018-04-30 MED ORDER — FAMOTIDINE IN NACL 20-0.9 MG/50ML-% IV SOLN
20.0000 mg | Freq: Once | INTRAVENOUS | Status: AC
  Administered 2018-04-30: 20 mg via INTRAVENOUS

## 2018-04-30 MED ORDER — DIPHENHYDRAMINE HCL 25 MG PO CAPS
50.0000 mg | ORAL_CAPSULE | Freq: Once | ORAL | Status: AC
  Administered 2018-04-30: 50 mg via ORAL

## 2018-04-30 MED ORDER — ACETAMINOPHEN 325 MG PO TABS
650.0000 mg | ORAL_TABLET | Freq: Once | ORAL | Status: AC
  Administered 2018-04-30: 650 mg via ORAL

## 2018-04-30 MED ORDER — METHYLPREDNISOLONE SODIUM SUCC 125 MG IJ SOLR
INTRAMUSCULAR | Status: AC
  Filled 2018-04-30: qty 2

## 2018-04-30 MED ORDER — ACETAMINOPHEN 325 MG PO TABS
ORAL_TABLET | ORAL | Status: AC
  Filled 2018-04-30: qty 2

## 2018-04-30 MED ORDER — SODIUM CHLORIDE 0.9 % IV SOLN
375.0000 mg/m2 | Freq: Once | INTRAVENOUS | Status: AC
  Administered 2018-04-30: 700 mg via INTRAVENOUS
  Filled 2018-04-30: qty 20

## 2018-04-30 MED ORDER — SODIUM CHLORIDE 0.9 % IV SOLN
Freq: Once | INTRAVENOUS | Status: AC
  Administered 2018-04-30: 13:00:00 via INTRAVENOUS
  Filled 2018-04-30: qty 250

## 2018-04-30 MED ORDER — METHYLPREDNISOLONE SODIUM SUCC 125 MG IJ SOLR
125.0000 mg | Freq: Every day | INTRAMUSCULAR | Status: AC
  Administered 2018-04-30: 125 mg via INTRAVENOUS

## 2018-04-30 NOTE — Patient Instructions (Signed)
[facility] Discharge Instructions for Patients Receiving Chemotherapy  Today you received the following chemotherapy agents:  Rituxan.  To help prevent nausea and vomiting after your treatment, we encourage you to take your nausea medication as directed.   If you develop nausea and vomiting that is not controlled by your nausea medication, call the clinic.   BELOW ARE SYMPTOMS THAT SHOULD BE REPORTED IMMEDIATELY:  *FEVER GREATER THAN 100.5 F  *CHILLS WITH OR WITHOUT FEVER  NAUSEA AND VOMITING THAT IS NOT CONTROLLED WITH YOUR NAUSEA MEDICATION  *UNUSUAL SHORTNESS OF BREATH  *UNUSUAL BRUISING OR BLEEDING  TENDERNESS IN MOUTH AND THROAT WITH OR WITHOUT PRESENCE OF ULCERS  *URINARY PROBLEMS  *BOWEL PROBLEMS  UNUSUAL RASH Items with * indicate a potential emergency and should be followed up as soon as possible.  Feel free to call the clinic should you have any questions or concerns. The clinic phone number is [phone].  Please show the CHEMO ALERT CARD at check-in to the Emergency Department and triage nurse.   

## 2018-05-02 ENCOUNTER — Telehealth: Payer: Self-pay

## 2018-05-02 NOTE — Telephone Encounter (Signed)
Per 9/3 los already scheduled °

## 2018-06-21 ENCOUNTER — Other Ambulatory Visit: Payer: Self-pay | Admitting: Hematology

## 2018-06-26 ENCOUNTER — Telehealth: Payer: Self-pay | Admitting: Hematology

## 2018-06-26 NOTE — Telephone Encounter (Signed)
[facility] out of office nov 4-nov 17. Per [facility] moved nov 4 appts to nov 21. Spoke with patient.

## 2018-07-01 ENCOUNTER — Other Ambulatory Visit: Payer: Medicare Other

## 2018-07-01 ENCOUNTER — Ambulatory Visit: Payer: Medicare Other

## 2018-07-01 ENCOUNTER — Ambulatory Visit: Payer: Medicare Other | Admitting: Hematology

## 2018-07-17 NOTE — Progress Notes (Signed)
HEMATOLOGY/ONCOLOGY CLINIC NOTE  Date of Service:  07/18/18    Patient Care Team: Christain Sacramento, MD as PCP - General (Family Medicine)  CHIEF COMPLAINTS/PURPOSE OF CONSULTATION:  F/u for SMZL  HISTORY OF PRESENTING ILLNESS:  Jared Nixon is a wonderful 67 y.o. male who has been referred to [place] by Dr Redmond Pulling, [name], MD for evaluation and management of new splenomegaly with lymphocytosis.  Initially, the patient presented into his PCP's office on 06/11/17 complaining of a small lump under his left lower ribcage which had been present for several weeks. This was palpable, per his PCP's notes and Dr Redmond Pulling subsequently ordered a CT A/P to evaluate this further. This was performed on 06/13/17 which confirmed the presence of splenomegaly and portal caval lymphadenopathy which was concerning for lymphoproliferative disease. He also had lab work (summarized as below) which also showed some gradual leukocytosis/lymphocytosis, with worsening anemia and thrombocytopenia. He denies any pain over the area of his splenomegaly, difficulty eating or premature satiation.   In a general sense, he had not had an significant chronic health issues which he is being treated for. He does struggle with some tinnitus involving only the left ear which began around the end of July and has somewhat worsened since. One year ago he also had bilateral inguinal hernia surgery and a basal cell carcinoma excision. He also notes in the 80s that he had a gynecomastia removed, but otherwise of recently he has been feeling at his baseline state-of-health. He was initially informed that he suffered from leukocytosis earlier this year by Dr Dois Davenport office.   He did smoke for a brief time several decades ago in his 17s (0.5ppd), but none since. He does drink alcohol, and on average he drinks 7-8 beers a week. He denies any chemical/radiaiton exposures or exposure during his career. He does not partake in illicit drug usage. He has  never previously needed a blood transfusion in the past.   He is currently prescribed Clonazepam and Ritalin to use on a daily basis which are both managed by Christain Sacramento, MD. He takes these for dealing with clutter/hoarding issues ongoing within his personal life.   Of note, the patient reports that his father did have bladder cancer. His paternal Aunts all were diagnosed with cancer, but he is unsure of which types. He does have a h/o CVA on his mothers side, but nothing else of note.   On ROS, he does report a 10lbs weight loss which was observed by Dr Redmond Pulling and was attributed to his usage of Ritalin. He also notes that he does suffer from sleeping issues. He does experience fatigue intermittently, however, this is not debilitating or hindering his daily activities. He denies fever, chills, night sweats, abdominal pain, nausea, vomiting, diarrhea, rash, or any other associated symptoms.   INTERVAL HISTORY   Jared Nixon is here for f/u of his SMZL presenting with mild asymptomatic anemia and thrombocytopenia. The patient's last visit with [place] was on 04/30/18. The pt reports that he is doing well overall.   The pt reports that he has recently had some arthritic pain which has worsened in the cold weather. He continues follow up with orthopedics.   The pt notes that otherwise he has not had any new concerns. He notes that he isn't having any fevers, but has occasional mild night sweats, not drenching. He has not noticed any new lumps or bumps and denies any abdominal pains.   Lab results today (  07/18/18) of CBC w/diff, CMP is as follows: all values are WNL except for RDW at 15.8, PLT at 134k, Glucose at 121. 07/18/18 LDH is at 147  On review of systems, pt reports occasional mild night sweats, arthritic pains, stable energy levels, and denies unexpected weight loss, abdominal pains, fevers, chills, drenching night sweats, mouth sores, noticing any new lumps or bumps, red/swollen/painful joints,  and any other symptoms.   MEDICAL HISTORY:  Past Medical History:  Diagnosis Date  . Sinus complaint   . Skin cancer     SURGICAL HISTORY: Past Surgical History:  Procedure Laterality Date  . basal cell carcinoma surgery    . CATARACT EXTRACTION      SOCIAL HISTORY: Social History   Socioeconomic History  . Marital status: Single    Spouse name: Not on file  . Number of children: Not on file  . Years of education: Not on file  . Highest education level: Not on file  Occupational History  . Not on file  Social Needs  . Financial resource strain: Not on file  . Food insecurity:    Worry: Not on file    Inability: Not on file  . Transportation needs:    Medical: Not on file    Non-medical: Not on file  Tobacco Use  . Smoking status: Never Smoker  . Smokeless tobacco: Never Used  Substance and Sexual Activity  . Alcohol use: Yes    Comment: occasional  . Drug use: No  . Sexual activity: Not on file  Lifestyle  . Physical activity:    Days per week: Not on file    Minutes per session: Not on file  . Stress: Not on file  Relationships  . Social connections:    Talks on phone: Not on file    Gets together: Not on file    Attends religious service: Not on file    Active member of club or organization: Not on file    Attends meetings of clubs or organizations: Not on file    Relationship status: Not on file  . Intimate partner violence:    Fear of current or ex partner: Not on file    Emotionally abused: Not on file    Physically abused: Not on file    Forced sexual activity: Not on file  Other Topics Concern  . Not on file  Social History Narrative  . Not on file   He is originally from [place], [place].   FAMILY HISTORY: No family history on file.  ALLERGIES:  is allergic to penicillins.  MEDICATIONS:  Current Outpatient Medications  Medication Sig Dispense Refill  . amLODipine (NORVASC) 5 MG tablet Take 1 tablet (5 mg total) by mouth daily. 30  tablet 0  . ClonazePAM (KLONOPIN PO) Take 1 mg by mouth 2 (two) times daily.     . diclofenac (VOLTAREN) 25 MG EC tablet Take by mouth 2 (two) times daily.    . fluticasone (FLONASE) 50 MCG/ACT nasal spray Place 1 spray into both nostrils daily.    . methylphenidate (RITALIN) 10 MG tablet Take 10 mg 3 (three) times daily by mouth.     . ondansetron (ZOFRAN ODT) 4 MG disintegrating tablet Take 1 tablet (4 mg total) by mouth every 8 (eight) hours as needed for nausea or vomiting. (Patient not taking: Reported on 11/27/2017) 20 tablet 0   No current facility-administered medications for this visit.     REVIEW OF SYSTEMS:    A 10+  POINT REVIEW OF SYSTEMS WAS OBTAINED including neurology, dermatology, psychiatry, cardiac, respiratory, lymph, extremities, GI, GU, Musculoskeletal, constitutional, breasts, reproductive, HEENT.  All pertinent positives are noted in the HPI.  All others are negative.   PHYSICAL EXAMINATION: ECOG PERFORMANCE STATUS: 1 - Symptomatic but completely ambulatory  VS stable stable. Reviewed in EPIC.  GENERAL:alert, in no acute distress and comfortable SKIN: no acute rashes, no significant lesions EYES: conjunctiva are pink and non-injected, sclera anicteric OROPHARYNX: MMM, no exudates, no oropharyngeal erythema or ulceration NECK: supple, no JVD LYMPH:  no palpable lymphadenopathy in the cervical, axillary or inguinal regions LUNGS: clear to auscultation b/l with normal respiratory effort HEART: regular rate & rhythm ABDOMEN:  normoactive bowel sounds , non tender, not distended. No palpable hepatosplenomegaly.  Extremity: no pedal edema PSYCH: alert & oriented x 3 with fluent speech NEURO: no focal motor/sensory deficits   LABORATORY DATA:  I have reviewed the data as listed  . CBC Latest Ref Rng & Units 07/18/2018 04/30/2018 02/25/2018  WBC 4.0 - 10.5 K/uL 6.8 6.7 6.3  Hemoglobin 13.0 - 17.0 g/dL 13.7 13.5 13.9  Hematocrit 39.0 - 52.0 % 40.0 38.9 39.0  Platelets  150 - 400 K/uL 134(L) 128(L) 111(L)   CBC    Component Value Date/Time   WBC 6.8 07/18/2018 1007   RBC 4.43 07/18/2018 1007   HGB 13.7 07/18/2018 1007   HGB 13.9 02/25/2018 1312   HGB 11.0 (L) 08/31/2017 1101   HCT 40.0 07/18/2018 1007   HCT 33.8 (L) 08/31/2017 1101   PLT 134 (L) 07/18/2018 1007   PLT 111 (L) 02/25/2018 1312   PLT 84 (L) 08/31/2017 1101   MCV 90.3 07/18/2018 1007   MCV 91.1 08/31/2017 1101   MCH 30.9 07/18/2018 1007   MCHC 34.3 07/18/2018 1007   RDW 15.8 (H) 07/18/2018 1007   RDW 17.3 (H) 08/31/2017 1101   LYMPHSABS 1.6 07/18/2018 1007   LYMPHSABS 11.4 (H) 08/31/2017 1101   MONOABS 0.6 07/18/2018 1007   MONOABS 0.7 08/31/2017 1101   EOSABS 0.1 07/18/2018 1007   EOSABS 0.1 08/31/2017 1101   BASOSABS 0.0 07/18/2018 1007   BASOSABS 0.0 08/31/2017 1101    . CMP Latest Ref Rng & Units 07/18/2018 04/30/2018 02/25/2018  Glucose 70 - 99 mg/dL 121(H) 118(H) 102(H)  BUN 8 - 23 mg/dL 17 23 18   Creatinine 0.61 - 1.24 mg/dL 0.88 0.90 0.85  Sodium 135 - 145 mmol/L 142 139 141  Potassium 3.5 - 5.1 mmol/L 3.9 4.1 3.9  Chloride 98 - 111 mmol/L 107 104 107  CO2 22 - 32 mmol/L 23 26 27   Calcium 8.9 - 10.3 mg/dL 9.5 9.8 9.7  Total Protein 6.5 - 8.1 g/dL 7.9 8.0 8.0  Total Bilirubin 0.3 - 1.2 mg/dL 0.6 0.7 0.7  Alkaline Phos 38 - 126 U/L 67 69 67  AST 15 - 41 U/L 23 21 22   ALT 0 - 44 U/L 32 26 31     -flow cytometry consistent with marginal zone lymphoma vs splenic Lymphoma      RADIOGRAPHIC STUDIES: I have personally reviewed the radiological images as listed and agreed with the findings in the report. No results found.  ASSESSMENT & PLAN:   Jared Nixon is a  67 y.o. male who presents to our clinic to discuss:  #1: Splenic marginal Zone lymphoma  Initially presented with Splenomegaly with worsening leukocytosis/lymphocytosis, anemia, and thrombocytopenia He completed 4/4 cycles of Rituxan 09/11/17-10/02/17.   US abdomen from 11/20/17 with  1. Splenomegaly  with splenic volume of 719 cubic cm, compared with PET-CT volume of 1100 cubic cm. 2. Cholelithiasis with no sonographic evidence for cholecystitis or biliary dilatation 3. Small cysts in the kidneys 4. Slight increased hepatic echogenicity, possible mild steatosis.   PLAN:  -Discussed the recommendation to continue maintenance Rituxan for a maximum of 2 years unless the pt developed any new intolerances -Discussed pt labwork today, 07/18/18; HGB stable at 13.7, PLT increased to 134k, blood counts and chemistries are otherwise stable. No lymphocytosis.  -The pt has no prohibitive toxicities from continuing with his fourth cycle of maintenance Rituxan at this time.   -The pt shows no clinical or lab progression of SMZL at this time.   -Will see the pt back in 2 months   #2 Elevated BP #3 Raynaud's  Syndrome Plan  -Continue amlodipine 5mg  po daily to treat both his Raynaud's and high BP.  -Encouraged pt to check BP with an arm cuff at home and f/u for BPmx with PCP   Please schedule next dose of maintenance Rituxan in 60 days with labs and MD visit    All of the patients questions were answered with apparent satisfaction. The patient knows to call the clinic with any problems, questions or concerns.  The total time spent in the appt was 25 minutes and more than 50% was on counseling and direct patient cares.    Sullivan Lone MD MS AAHIVMS [facility] Hematology/Oncology Physician [facility]  (Office):       [phone]  (Work cell):  [phone] (Fax):           [phone]  07/18/2018 12:49 PM   I, Baldwin Jamaica, am acting as a scribe for Dr. Sullivan Lone.   .I have reviewed the above documentation for accuracy and completeness, and I agree with the above. Brunetta Genera MD

## 2018-07-18 ENCOUNTER — Inpatient Hospital Stay: Payer: Medicare Other | Attending: Hematology

## 2018-07-18 ENCOUNTER — Inpatient Hospital Stay (HOSPITAL_BASED_OUTPATIENT_CLINIC_OR_DEPARTMENT_OTHER): Payer: Medicare Other | Admitting: Hematology

## 2018-07-18 ENCOUNTER — Inpatient Hospital Stay: Payer: Medicare Other

## 2018-07-18 VITALS — BP 154/73 | HR 80 | Temp 97.7°F | Resp 18 | Ht 71.0 in | Wt 180.4 lb

## 2018-07-18 VITALS — BP 140/77 | HR 76 | Temp 97.8°F | Resp 16

## 2018-07-18 DIAGNOSIS — R5383 Other fatigue: Secondary | ICD-10-CM | POA: Insufficient documentation

## 2018-07-18 DIAGNOSIS — K8020 Calculus of gallbladder without cholecystitis without obstruction: Secondary | ICD-10-CM

## 2018-07-18 DIAGNOSIS — C8307 Small cell B-cell lymphoma, spleen: Secondary | ICD-10-CM

## 2018-07-18 DIAGNOSIS — Z79899 Other long term (current) drug therapy: Secondary | ICD-10-CM | POA: Diagnosis not present

## 2018-07-18 DIAGNOSIS — Z5112 Encounter for antineoplastic immunotherapy: Secondary | ICD-10-CM | POA: Insufficient documentation

## 2018-07-18 DIAGNOSIS — N281 Cyst of kidney, acquired: Secondary | ICD-10-CM

## 2018-07-18 DIAGNOSIS — Z7189 Other specified counseling: Secondary | ICD-10-CM

## 2018-07-18 DIAGNOSIS — R030 Elevated blood-pressure reading, without diagnosis of hypertension: Secondary | ICD-10-CM | POA: Insufficient documentation

## 2018-07-18 DIAGNOSIS — Z85828 Personal history of other malignant neoplasm of skin: Secondary | ICD-10-CM | POA: Diagnosis not present

## 2018-07-18 DIAGNOSIS — D649 Anemia, unspecified: Secondary | ICD-10-CM | POA: Diagnosis not present

## 2018-07-18 DIAGNOSIS — I7300 Raynaud's syndrome without gangrene: Secondary | ICD-10-CM | POA: Insufficient documentation

## 2018-07-18 DIAGNOSIS — H9319 Tinnitus, unspecified ear: Secondary | ICD-10-CM

## 2018-07-18 DIAGNOSIS — D72820 Lymphocytosis (symptomatic): Secondary | ICD-10-CM | POA: Insufficient documentation

## 2018-07-18 DIAGNOSIS — Z8052 Family history of malignant neoplasm of bladder: Secondary | ICD-10-CM

## 2018-07-18 LAB — CBC WITH DIFFERENTIAL/PLATELET
ABS IMMATURE GRANULOCYTES: 0.03 10*3/uL (ref 0.00–0.07)
BASOS PCT: 0 %
Basophils Absolute: 0 10*3/uL (ref 0.0–0.1)
EOS PCT: 1 %
Eosinophils Absolute: 0.1 10*3/uL (ref 0.0–0.5)
HCT: 40 % (ref 39.0–52.0)
HEMOGLOBIN: 13.7 g/dL (ref 13.0–17.0)
Immature Granulocytes: 0 %
LYMPHS PCT: 24 %
Lymphs Abs: 1.6 10*3/uL (ref 0.7–4.0)
MCH: 30.9 pg (ref 26.0–34.0)
MCHC: 34.3 g/dL (ref 30.0–36.0)
MCV: 90.3 fL (ref 80.0–100.0)
MONO ABS: 0.6 10*3/uL (ref 0.1–1.0)
MONOS PCT: 9 %
NEUTROS ABS: 4.4 10*3/uL (ref 1.7–7.7)
Neutrophils Relative %: 66 %
Platelets: 134 10*3/uL — ABNORMAL LOW (ref 150–400)
RBC: 4.43 MIL/uL (ref 4.22–5.81)
RDW: 15.8 % — ABNORMAL HIGH (ref 11.5–15.5)
WBC: 6.8 10*3/uL (ref 4.0–10.5)
nRBC: 0 % (ref 0.0–0.2)

## 2018-07-18 LAB — CMP (CANCER CENTER ONLY)
ALBUMIN: 4.2 g/dL (ref 3.5–5.0)
ALK PHOS: 67 U/L (ref 38–126)
ALT: 32 U/L (ref 0–44)
ANION GAP: 12 (ref 5–15)
AST: 23 U/L (ref 15–41)
BUN: 17 mg/dL (ref 8–23)
CO2: 23 mmol/L (ref 22–32)
Calcium: 9.5 mg/dL (ref 8.9–10.3)
Chloride: 107 mmol/L (ref 98–111)
Creatinine: 0.88 mg/dL (ref 0.61–1.24)
GFR, Estimated: >60 mL/min (ref >60)
GLUCOSE: 121 mg/dL — AB (ref 70–99)
Potassium: 3.9 mmol/L (ref 3.5–5.1)
SODIUM: 142 mmol/L (ref 135–145)
Total Bilirubin: 0.6 mg/dL (ref 0.3–1.2)
Total Protein: 7.9 g/dL (ref 6.5–8.1)

## 2018-07-18 LAB — LACTATE DEHYDROGENASE: LDH: 147 U/L (ref 98–192)

## 2018-07-18 MED ORDER — DIPHENHYDRAMINE HCL 25 MG PO CAPS
ORAL_CAPSULE | ORAL | Status: AC
  Filled 2018-07-18: qty 2

## 2018-07-18 MED ORDER — FAMOTIDINE IN NACL 20-0.9 MG/50ML-% IV SOLN
INTRAVENOUS | Status: AC
  Filled 2018-07-18: qty 50

## 2018-07-18 MED ORDER — ACETAMINOPHEN 325 MG PO TABS
650.0000 mg | ORAL_TABLET | Freq: Once | ORAL | Status: AC
  Administered 2018-07-18: 650 mg via ORAL

## 2018-07-18 MED ORDER — SODIUM CHLORIDE 0.9 % IV SOLN
375.0000 mg/m2 | Freq: Once | INTRAVENOUS | Status: AC
  Administered 2018-07-18: 700 mg via INTRAVENOUS
  Filled 2018-07-18: qty 20

## 2018-07-18 MED ORDER — METHYLPREDNISOLONE SODIUM SUCC 125 MG IJ SOLR
125.0000 mg | Freq: Every day | INTRAMUSCULAR | Status: AC
  Administered 2018-07-18: 125 mg via INTRAVENOUS

## 2018-07-18 MED ORDER — SODIUM CHLORIDE 0.9 % IV SOLN
Freq: Once | INTRAVENOUS | Status: AC
  Administered 2018-07-18: 13:00:00 via INTRAVENOUS
  Filled 2018-07-18: qty 250

## 2018-07-18 MED ORDER — FAMOTIDINE IN NACL 20-0.9 MG/50ML-% IV SOLN
20.0000 mg | Freq: Once | INTRAVENOUS | Status: AC
  Administered 2018-07-18: 20 mg via INTRAVENOUS

## 2018-07-18 MED ORDER — ACETAMINOPHEN 325 MG PO TABS
ORAL_TABLET | ORAL | Status: AC
  Filled 2018-07-18: qty 2

## 2018-07-18 MED ORDER — DIPHENHYDRAMINE HCL 25 MG PO CAPS
ORAL_CAPSULE | ORAL | Status: AC
  Filled 2018-07-18: qty 1

## 2018-07-18 MED ORDER — METHYLPREDNISOLONE SODIUM SUCC 125 MG IJ SOLR
INTRAMUSCULAR | Status: AC
  Filled 2018-07-18: qty 2

## 2018-07-18 MED ORDER — DIPHENHYDRAMINE HCL 25 MG PO CAPS
50.0000 mg | ORAL_CAPSULE | Freq: Once | ORAL | Status: AC
  Administered 2018-07-18: 50 mg via ORAL

## 2018-07-18 NOTE — Patient Instructions (Signed)
[facility] °Discharge Instructions for Patients Receiving Chemotherapy ° °Today you received the following chemotherapy agents: Rituximab (Rituxan) ° °To help prevent nausea and vomiting after your treatment, we encourage you to take your nausea medication as directed.  °  °If you develop nausea and vomiting that is not controlled by your nausea medication, call the clinic.  ° °BELOW ARE SYMPTOMS THAT SHOULD BE REPORTED IMMEDIATELY: °· *FEVER GREATER THAN 100.5 F °· *CHILLS WITH OR WITHOUT FEVER °· NAUSEA AND VOMITING THAT IS NOT CONTROLLED WITH YOUR NAUSEA MEDICATION °· *UNUSUAL SHORTNESS OF BREATH °· *UNUSUAL BRUISING OR BLEEDING °· TENDERNESS IN MOUTH AND THROAT WITH OR WITHOUT PRESENCE OF ULCERS °· *URINARY PROBLEMS °· *BOWEL PROBLEMS °· UNUSUAL RASH °Items with * indicate a potential emergency and should be followed up as soon as possible. ° °Feel free to call the clinic should you have any questions or concerns. The clinic phone number is [phone]. ° °Please show the CHEMO ALERT CARD at check-in to the Emergency Department and triage nurse. ° ° °

## 2018-07-22 ENCOUNTER — Telehealth: Payer: Self-pay | Admitting: Hematology

## 2018-07-22 NOTE — Telephone Encounter (Signed)
Left message re 09/12/18 appointments. January 3rd appointments cxd. Per 11/21 los next dose rituxan 60 days. schedule mailed.

## 2018-07-27 IMAGING — CT NM PET TUM IMG INITIAL (PI) SKULL BASE T - THIGH
1 of 8 series · 1 of 25 positions shown · non-contrast
Comparison: CT scan from 06/13/2017

CLINICAL DATA: Initial treatment strategy for marginal zone
lymphoma

EXAM:
NUCLEAR MEDICINE PET SKULL BASE TO THIGH
TECHNIQUE: 8.5 mCi F-18 FDG was injected intravenously. Full-ring PET imaging
was performed from the skull base to thigh after the radiotracer. CT
data was obtained and used for attenuation correction and anatomic
localization.
FASTING BLOOD GLUCOSE:  Value: 120 mg/dl

[Series 4: ct sk_thigh 5.0 b31f · axial · 5.0mm · 0.98mm/px · 1 of 231 slices shown]
[im 231/231  brain]
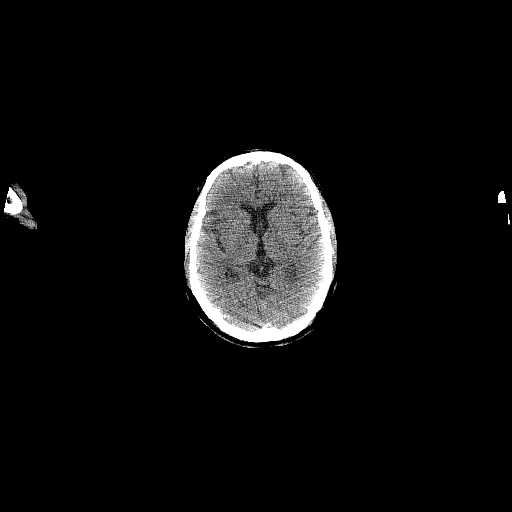

[1 of 25 positions shown; findings below may reference images not displayed]

FINDINGS: NECK

Primarily low grade activity in scattered level IIa, level IIb,
level III, level IV, and level V lymph nodes which are generally not
significant increase in size but are increased in number. A
representative index right level V lymph node measures 1.1 cm in
short axis on image 36/4 and has a maximum standard uptake value of
2.0.

CHEST

Bilateral axillary and subpectoral adenopathy is present. A
representative solid-appearing left axillary lymph node measures
cm in short axis on image 61/4 with maximum SUV 4.5.

Background mediastinal blood pool activity is SUV 2.3.

Small mediastinal lymph nodes are not appreciably hypermetabolic.

No significant pulmonary nodules are identified.

ABDOMEN/PELVIS

The spleen measures 16.8 by 7.3 by 17.2 cm (volume = 6644 cm^3)
and a representative portion of the spleen has a maximum SUV of 2.9.
Background hepatic activity SUV is 3.2.

Porta hepatis adenopathy is present, including a portacaval node
measuring 1.7 cm in short axis on image 121/4 with a maximum SUV of
2.9.

Photopenic right posterior renal cyst and photopenic left lateral
renal cyst. Thick-walled urinary bladder although this may be from
nondistention. Accentuated activity along the lower abdominal wall
hernia mesh.

Aortoiliac atherosclerotic vascular disease.

SKELETON

No focal hypermetabolic activity to suggest skeletal metastasis.
IMPRESSION: 1. Prominent splenomegaly with low-grade ([HOSPITAL] 3) activity
diffusely in the spleen.
2. Increased number of scattered lymph nodes in the neck as detailed
above, most of which are below pathologic size thresholds, and
demonstrate [HOSPITAL] 2 activity.
3. Axillary and subpectoral lymph nodes are enlarged and demonstrate
[HOSPITAL] 4 activity.
4. Mildly prominent porta hepatis lymph nodes demonstrate [HOSPITAL]
3 activity.
5.  Aortic Atherosclerosis ([id number]).

## 2018-08-30 ENCOUNTER — Ambulatory Visit: Payer: Medicare Other | Admitting: Hematology

## 2018-08-30 ENCOUNTER — Ambulatory Visit: Payer: Medicare Other

## 2018-08-30 ENCOUNTER — Other Ambulatory Visit: Payer: Medicare Other

## 2018-09-11 NOTE — Progress Notes (Signed)
HEMATOLOGY/ONCOLOGY CLINIC NOTE  Date of Service:  09/12/18    Patient Care Team: Christain Sacramento, MD as PCP - General (Family Medicine)  CHIEF COMPLAINTS/PURPOSE OF CONSULTATION:  F/u for SMZL  HISTORY OF PRESENTING ILLNESS:  Jared Nixon is a wonderful 68 y.o. male who has been referred to [place] by Dr Redmond Pulling, [name], MD for evaluation and management of new splenomegaly with lymphocytosis.  Initially, the patient presented into his PCP's office on 06/11/17 complaining of a small lump under his left lower ribcage which had been present for several weeks. This was palpable, per his PCP's notes and Dr Redmond Pulling subsequently ordered a CT A/P to evaluate this further. This was performed on 06/13/17 which confirmed the presence of splenomegaly and portal caval lymphadenopathy which was concerning for lymphoproliferative disease. He also had lab work (summarized as below) which also showed some gradual leukocytosis/lymphocytosis, with worsening anemia and thrombocytopenia. He denies any pain over the area of his splenomegaly, difficulty eating or premature satiation.   In a general sense, he had not had an significant chronic health issues which he is being treated for. He does struggle with some tinnitus involving only the left ear which began around the end of July and has somewhat worsened since. One year ago he also had bilateral inguinal hernia surgery and a basal cell carcinoma excision. He also notes in the 80s that he had a gynecomastia removed, but otherwise of recently he has been feeling at his baseline state-of-health. He was initially informed that he suffered from leukocytosis earlier this year by Dr Dois Davenport office.   He did smoke for a brief time several decades ago in his 3s (0.5ppd), but none since. He does drink alcohol, and on average he drinks 7-8 beers a week. He denies any chemical/radiaiton exposures or exposure during his career. He does not partake in illicit drug usage. He has  never previously needed a blood transfusion in the past.   He is currently prescribed Clonazepam and Ritalin to use on a daily basis which are both managed by Christain Sacramento, MD. He takes these for dealing with clutter/hoarding issues ongoing within his personal life.   Of note, the patient reports that his father did have bladder cancer. His paternal Aunts all were diagnosed with cancer, but he is unsure of which types. He does have a h/o CVA on his mothers side, but nothing else of note.   On ROS, he does report a 10lbs weight loss which was observed by Dr Redmond Pulling and was attributed to his usage of Ritalin. He also notes that he does suffer from sleeping issues. He does experience fatigue intermittently, however, this is not debilitating or hindering his daily activities. He denies fever, chills, night sweats, abdominal pain, nausea, vomiting, diarrhea, rash, or any other associated symptoms.   INTERVAL HISTORY   Jared Nixon is here for f/u of his SMZL presenting with mild asymptomatic anemia and thrombocytopenia. The patient's last visit with [place] was on 07/18/18. The pt reports that he is doing well overall.   The pt reports that he has had some mild sporadic right knee pain and other generalized aches which have been stable. Otherwise, he denies any new concerns and denies any current concerns for infections. The pt denies fevers, chills, night sweats, and unexpected weight loss. He notes that he is eating well and has gained a couple pounds. He denies abdominal pains, noticing any new lumps or bumps or any constitutional symptoms.  Lab results today (09/12/18) of CBC w/diff and CMP is as follows: all values are WNL except for RDW at 15.9, PLT at 137k, Glucose at 130. 09/12/18 LDH at 128  On review of systems, pt reports right knee aches, stable energy levels, eating well, and denies fevers, chills, night sweats, concerns of infections, unexpected weight loss, abdominal pains, noticing any new  lumps or bumps, and any other symptoms.    MEDICAL HISTORY:  Past Medical History:  Diagnosis Date  . Sinus complaint   . Skin cancer     SURGICAL HISTORY: Past Surgical History:  Procedure Laterality Date  . basal cell carcinoma surgery    . CATARACT EXTRACTION      SOCIAL HISTORY: Social History   Socioeconomic History  . Marital status: Single    Spouse name: Not on file  . Number of children: Not on file  . Years of education: Not on file  . Highest education level: Not on file  Occupational History  . Not on file  Social Needs  . Financial resource strain: Not on file  . Food insecurity:    Worry: Not on file    Inability: Not on file  . Transportation needs:    Medical: Not on file    Non-medical: Not on file  Tobacco Use  . Smoking status: Never Smoker  . Smokeless tobacco: Never Used  Substance and Sexual Activity  . Alcohol use: Yes    Comment: occasional  . Drug use: No  . Sexual activity: Not on file  Lifestyle  . Physical activity:    Days per week: Not on file    Minutes per session: Not on file  . Stress: Not on file  Relationships  . Social connections:    Talks on phone: Not on file    Gets together: Not on file    Attends religious service: Not on file    Active member of club or organization: Not on file    Attends meetings of clubs or organizations: Not on file    Relationship status: Not on file  . Intimate partner violence:    Fear of current or ex partner: Not on file    Emotionally abused: Not on file    Physically abused: Not on file    Forced sexual activity: Not on file  Other Topics Concern  . Not on file  Social History Narrative  . Not on file   He is originally from [place], [place].   FAMILY HISTORY: No family history on file.  ALLERGIES:  is allergic to penicillins.  MEDICATIONS:  Current Outpatient Medications  Medication Sig Dispense Refill  . amLODipine (NORVASC) 5 MG tablet Take 1 tablet (5 mg total) by  mouth daily. 30 tablet 0  . ClonazePAM (KLONOPIN PO) Take 1 mg by mouth 2 (two) times daily.     . diclofenac (VOLTAREN) 25 MG EC tablet Take by mouth 2 (two) times daily.    . fluticasone (FLONASE) 50 MCG/ACT nasal spray Place 1 spray into both nostrils daily.    . methylphenidate (RITALIN) 10 MG tablet Take 10 mg 3 (three) times daily by mouth.     . ondansetron (ZOFRAN ODT) 4 MG disintegrating tablet Take 1 tablet (4 mg total) by mouth every 8 (eight) hours as needed for nausea or vomiting. (Patient not taking: Reported on 11/27/2017) 20 tablet 0   No current facility-administered medications for this visit.     REVIEW OF SYSTEMS:    A  10+ POINT REVIEW OF SYSTEMS WAS OBTAINED including neurology, dermatology, psychiatry, cardiac, respiratory, lymph, extremities, GI, GU, Musculoskeletal, constitutional, breasts, reproductive, HEENT.  All pertinent positives are noted in the HPI.  All others are negative.   PHYSICAL EXAMINATION: ECOG PERFORMANCE STATUS: 1 - Symptomatic but completely ambulatory  VS stable stable. Reviewed in EPIC.  GENERAL:alert, in no acute distress and comfortable SKIN: no acute rashes, no significant lesions EYES: conjunctiva are pink and non-injected, sclera anicteric OROPHARYNX: MMM, no exudates, no oropharyngeal erythema or ulceration NECK: supple, no JVD LYMPH:  no palpable lymphadenopathy in the cervical, axillary or inguinal regions LUNGS: clear to auscultation b/l with normal respiratory effort HEART: regular rate & rhythm ABDOMEN:  normoactive bowel sounds , non tender, not distended. No palpable hepatosplenomegaly.  Extremity: no pedal edema PSYCH: alert & oriented x 3 with fluent speech NEURO: no focal motor/sensory deficits    LABORATORY DATA:  I have reviewed the data as listed  . CBC Latest Ref Rng & Units 09/12/2018 07/18/2018 04/30/2018  WBC 4.0 - 10.5 K/uL 6.3 6.8 6.7  Hemoglobin 13.0 - 17.0 g/dL 14.2 13.7 13.5  Hematocrit 39.0 - 52.0 % 40.5 40.0  38.9  Platelets 150 - 400 K/uL 137(L) 134(L) 128(L)   CBC    Component Value Date/Time   WBC 6.3 09/12/2018 0904   RBC 4.46 09/12/2018 0904   HGB 14.2 09/12/2018 0904   HGB 13.9 02/25/2018 1312   HGB 11.0 (L) 08/31/2017 1101   HCT 40.5 09/12/2018 0904   HCT 33.8 (L) 08/31/2017 1101   PLT 137 (L) 09/12/2018 0904   PLT 111 (L) 02/25/2018 1312   PLT 84 (L) 08/31/2017 1101   MCV 90.8 09/12/2018 0904   MCV 91.1 08/31/2017 1101   MCH 31.8 09/12/2018 0904   MCHC 35.1 09/12/2018 0904   RDW 15.9 (H) 09/12/2018 0904   RDW 17.3 (H) 08/31/2017 1101   LYMPHSABS 2.1 09/12/2018 0904   LYMPHSABS 11.4 (H) 08/31/2017 1101   MONOABS 0.6 09/12/2018 0904   MONOABS 0.7 08/31/2017 1101   EOSABS 0.1 09/12/2018 0904   EOSABS 0.1 08/31/2017 1101   BASOSABS 0.0 09/12/2018 0904   BASOSABS 0.0 08/31/2017 1101    . CMP Latest Ref Rng & Units 09/12/2018 07/18/2018 04/30/2018  Glucose 70 - 99 mg/dL 130(H) 121(H) 118(H)  BUN 8 - 23 mg/dL 20 17 23   Creatinine 0.61 - 1.24 mg/dL 0.90 0.88 0.90  Sodium 135 - 145 mmol/L 141 142 139  Potassium 3.5 - 5.1 mmol/L 4.1 3.9 4.1  Chloride 98 - 111 mmol/L 106 107 104  CO2 22 - 32 mmol/L 24 23 26   Calcium 8.9 - 10.3 mg/dL 9.5 9.5 9.8  Total Protein 6.5 - 8.1 g/dL 7.9 7.9 8.0  Total Bilirubin 0.3 - 1.2 mg/dL 0.7 0.6 0.7  Alkaline Phos 38 - 126 U/L 66 67 69  AST 15 - 41 U/L 25 23 21   ALT 0 - 44 U/L 36 32 26     -flow cytometry consistent with marginal zone lymphoma vs splenic Lymphoma      RADIOGRAPHIC STUDIES: I have personally reviewed the radiological images as listed and agreed with the findings in the report. No results found.  ASSESSMENT & PLAN:   WANDELL SCULLION is a  68 y.o. male who presents to our clinic to discuss:  #1: Splenic marginal Zone lymphoma  Initially presented with Splenomegaly with worsening leukocytosis/lymphocytosis, anemia, and thrombocytopenia He completed 4/4 cycles of Rituxan 09/11/17-10/02/17.   US abdomen from 11/20/17 with  1. Splenomegaly with splenic volume of 719 cubic cm, compared with PET-CT volume of 1100 cubic cm. 2. Cholelithiasis with no sonographic evidence for cholecystitis or biliary dilatation 3. Small cysts in the kidneys 4. Slight increased hepatic echogenicity, possible mild steatosis.   PLAN:  -Discussed pt labwork today, 09/12/18; HGB continues to be normal at 14.2, PLT improved to 137k, chemistries stable. LDH at 128.  -The pt has no prohibitive toxicities from continuing his fifthe cycle of maintenance Rituxan at this time.   -The pt shows no clinical or lab progression of SMZL at this time. -Discussed the recommendation to continue maintenance Rituxan for a maximum of 2 years unless the pt developed any new intolerances -Will see the pt back in 2 months   #2 Elevated BP #3 Raynaud's  Syndrome Plan  -Continue amlodipine 5mg  po daily to treat both his Raynaud's and high BP.  -Encouraged pt to check BP with an arm cuff at home and f/u for BPmx with PCP   Please schedule next 2 maintenance dose of Rituxan as ordered q60days with labs and MD with each dose.   All of the patients questions were answered with apparent satisfaction. The patient knows to call the clinic with any problems, questions or concerns.  The total time spent in the appt was 25 minutes and more than 50% was on counseling and direct patient cares.    Sullivan Lone MD MS AAHIVMS [facility] Hematology/Oncology Physician [facility]  (Office):       [phone]  (Work cell):  [phone] (Fax):           [phone]  09/12/2018 10:33 AM   I, Baldwin Jamaica, am acting as a scribe for Dr. Sullivan Lone.   .I have reviewed the above documentation for accuracy and completeness, and I agree with the above. Brunetta Genera MD

## 2018-09-12 ENCOUNTER — Inpatient Hospital Stay: Payer: Medicare Other | Attending: Hematology

## 2018-09-12 ENCOUNTER — Inpatient Hospital Stay: Payer: Medicare Other

## 2018-09-12 ENCOUNTER — Inpatient Hospital Stay (HOSPITAL_BASED_OUTPATIENT_CLINIC_OR_DEPARTMENT_OTHER): Payer: Medicare Other | Admitting: Hematology

## 2018-09-12 VITALS — BP 165/77 | HR 86 | Temp 98.2°F | Resp 18 | Ht 71.0 in | Wt 183.9 lb

## 2018-09-12 VITALS — BP 150/80 | HR 91 | Temp 97.7°F | Resp 20

## 2018-09-12 DIAGNOSIS — Z8052 Family history of malignant neoplasm of bladder: Secondary | ICD-10-CM | POA: Insufficient documentation

## 2018-09-12 DIAGNOSIS — I7300 Raynaud's syndrome without gangrene: Secondary | ICD-10-CM | POA: Diagnosis not present

## 2018-09-12 DIAGNOSIS — C8307 Small cell B-cell lymphoma, spleen: Secondary | ICD-10-CM

## 2018-09-12 DIAGNOSIS — I10 Essential (primary) hypertension: Secondary | ICD-10-CM

## 2018-09-12 DIAGNOSIS — K8020 Calculus of gallbladder without cholecystitis without obstruction: Secondary | ICD-10-CM

## 2018-09-12 DIAGNOSIS — Z85828 Personal history of other malignant neoplasm of skin: Secondary | ICD-10-CM

## 2018-09-12 DIAGNOSIS — Z79899 Other long term (current) drug therapy: Secondary | ICD-10-CM | POA: Diagnosis not present

## 2018-09-12 DIAGNOSIS — R634 Abnormal weight loss: Secondary | ICD-10-CM

## 2018-09-12 DIAGNOSIS — Z7189 Other specified counseling: Secondary | ICD-10-CM

## 2018-09-12 DIAGNOSIS — Z809 Family history of malignant neoplasm, unspecified: Secondary | ICD-10-CM | POA: Insufficient documentation

## 2018-09-12 DIAGNOSIS — Z87891 Personal history of nicotine dependence: Secondary | ICD-10-CM

## 2018-09-12 DIAGNOSIS — N281 Cyst of kidney, acquired: Secondary | ICD-10-CM

## 2018-09-12 DIAGNOSIS — Z5112 Encounter for antineoplastic immunotherapy: Secondary | ICD-10-CM | POA: Insufficient documentation

## 2018-09-12 LAB — CMP (CANCER CENTER ONLY)
ALBUMIN: 4.2 g/dL (ref 3.5–5.0)
ALT: 36 U/L (ref 0–44)
ANION GAP: 11 (ref 5–15)
AST: 25 U/L (ref 15–41)
Alkaline Phosphatase: 66 U/L (ref 38–126)
BUN: 20 mg/dL (ref 8–23)
CHLORIDE: 106 mmol/L (ref 98–111)
CO2: 24 mmol/L (ref 22–32)
Calcium: 9.5 mg/dL (ref 8.9–10.3)
Creatinine: 0.9 mg/dL (ref 0.61–1.24)
GFR, Est AFR Am: >60 mL/min (ref >60)
GFR, Estimated: >60 mL/min (ref >60)
GLUCOSE: 130 mg/dL — AB (ref 70–99)
Potassium: 4.1 mmol/L (ref 3.5–5.1)
SODIUM: 141 mmol/L (ref 135–145)
Total Bilirubin: 0.7 mg/dL (ref 0.3–1.2)
Total Protein: 7.9 g/dL (ref 6.5–8.1)

## 2018-09-12 LAB — CBC WITH DIFFERENTIAL/PLATELET
Abs Immature Granulocytes: 0.02 10*3/uL (ref 0.00–0.07)
BASOS ABS: 0 10*3/uL (ref 0.0–0.1)
Basophils Relative: 1 %
EOS ABS: 0.1 10*3/uL (ref 0.0–0.5)
EOS PCT: 2 %
HCT: 40.5 % (ref 39.0–52.0)
Hemoglobin: 14.2 g/dL (ref 13.0–17.0)
Immature Granulocytes: 0 %
LYMPHS PCT: 33 %
Lymphs Abs: 2.1 10*3/uL (ref 0.7–4.0)
MCH: 31.8 pg (ref 26.0–34.0)
MCHC: 35.1 g/dL (ref 30.0–36.0)
MCV: 90.8 fL (ref 80.0–100.0)
Monocytes Absolute: 0.6 10*3/uL (ref 0.1–1.0)
Monocytes Relative: 10 %
Neutro Abs: 3.4 10*3/uL (ref 1.7–7.7)
Neutrophils Relative %: 54 %
Platelets: 137 10*3/uL — ABNORMAL LOW (ref 150–400)
RBC: 4.46 MIL/uL (ref 4.22–5.81)
RDW: 15.9 % — ABNORMAL HIGH (ref 11.5–15.5)
WBC: 6.3 10*3/uL (ref 4.0–10.5)
nRBC: 0 % (ref 0.0–0.2)

## 2018-09-12 LAB — LACTATE DEHYDROGENASE: LDH: 128 U/L (ref 98–192)

## 2018-09-12 MED ORDER — METHYLPREDNISOLONE SODIUM SUCC 125 MG IJ SOLR
INTRAMUSCULAR | Status: AC
  Filled 2018-09-12: qty 2

## 2018-09-12 MED ORDER — DIPHENHYDRAMINE HCL 25 MG PO CAPS
ORAL_CAPSULE | ORAL | Status: AC
  Filled 2018-09-12: qty 2

## 2018-09-12 MED ORDER — SODIUM CHLORIDE 0.9 % IV SOLN
Freq: Once | INTRAVENOUS | Status: AC
  Administered 2018-09-12: 12:00:00 via INTRAVENOUS
  Filled 2018-09-12: qty 250

## 2018-09-12 MED ORDER — SODIUM CHLORIDE 0.9 % IV SOLN
375.0000 mg/m2 | Freq: Once | INTRAVENOUS | Status: AC
  Administered 2018-09-12: 700 mg via INTRAVENOUS
  Filled 2018-09-12: qty 50

## 2018-09-12 MED ORDER — ACETAMINOPHEN 325 MG PO TABS
ORAL_TABLET | ORAL | Status: AC
  Filled 2018-09-12: qty 2

## 2018-09-12 MED ORDER — FAMOTIDINE IN NACL 20-0.9 MG/50ML-% IV SOLN
20.0000 mg | Freq: Once | INTRAVENOUS | Status: AC
  Administered 2018-09-12: 20 mg via INTRAVENOUS

## 2018-09-12 MED ORDER — FAMOTIDINE IN NACL 20-0.9 MG/50ML-% IV SOLN
INTRAVENOUS | Status: AC
  Filled 2018-09-12: qty 50

## 2018-09-12 MED ORDER — ACETAMINOPHEN 325 MG PO TABS
650.0000 mg | ORAL_TABLET | Freq: Once | ORAL | Status: AC
  Administered 2018-09-12: 650 mg via ORAL

## 2018-09-12 MED ORDER — DIPHENHYDRAMINE HCL 25 MG PO CAPS
50.0000 mg | ORAL_CAPSULE | Freq: Once | ORAL | Status: AC
  Administered 2018-09-12: 50 mg via ORAL

## 2018-09-12 MED ORDER — METHYLPREDNISOLONE SODIUM SUCC 125 MG IJ SOLR
125.0000 mg | Freq: Every day | INTRAMUSCULAR | Status: AC
  Administered 2018-09-12: 125 mg via INTRAVENOUS

## 2018-09-12 NOTE — Patient Instructions (Signed)
[facility] Discharge Instructions for Patients Receiving Chemotherapy  Today you received the following chemotherapy agents Rituximab (RITUXAN).  To help prevent nausea and vomiting after your treatment, we encourage you to take your nausea medication as prescribed.   If you develop nausea and vomiting that is not controlled by your nausea medication, call the clinic.   BELOW ARE SYMPTOMS THAT SHOULD BE REPORTED IMMEDIATELY:  *FEVER GREATER THAN 100.5 F  *CHILLS WITH OR WITHOUT FEVER  NAUSEA AND VOMITING THAT IS NOT CONTROLLED WITH YOUR NAUSEA MEDICATION  *UNUSUAL SHORTNESS OF BREATH  *UNUSUAL BRUISING OR BLEEDING  TENDERNESS IN MOUTH AND THROAT WITH OR WITHOUT PRESENCE OF ULCERS  *URINARY PROBLEMS  *BOWEL PROBLEMS  UNUSUAL RASH Items with * indicate a potential emergency and should be followed up as soon as possible.  Feel free to call the clinic should you have any questions or concerns. The clinic phone number is [phone].  Please show the CHEMO ALERT CARD at check-in to the Emergency Department and triage nurse.   

## 2018-09-12 NOTE — Patient Instructions (Signed)
Thank you for choosing [facility] to provide your oncology and hematology care.  To afford each patient quality time with our providers, please arrive 30 minutes before your scheduled appointment time.  If you arrive late for your appointment, you may be asked to reschedule.  We strive to give you quality time with our providers, and arriving late affects you and other patients whose appointments are after yours.    If you are a no show for multiple scheduled visits, you may be dismissed from the clinic at the providers discretion.     Again, thank you for choosing [facility], our hope is that these requests will decrease the amount of time that you wait before being seen by our physicians.  ______________________________________________________________________   Should you have questions after your visit to the [facility], please contact our office at [phone] between the hours of 8:30 and 4:30 p.m.    Voicemails left after 4:30p.m will not be returned until the following business day.     For prescription refill requests, please have your pharmacy contact us directly.  Please also try to allow 48 hours for prescription requests.     Please contact the scheduling department for questions regarding scheduling.  For scheduling of procedures such as PET scans, CT scans, MRI, Ultrasound, etc please contact central scheduling at [phone].     Resources For Cancer Patients and Caregivers:    Oncolink.org:  A wonderful resource for patients and healthcare providers for information regarding your disease, ways to tract your treatment, what to expect, etc.      American Cancer Society:  800-227-2345  Can help patients locate various types of support and financial assistance   Cancer Care: 1-800-813-HOPE (4673) Provides financial assistance, online support groups, medication/co-pay assistance.     [place] DSS:  336-641-3447 Where to apply  for food stamps, Medicaid, and utility assistance   Medicare Rights Center: 800-333-4114 Helps people with Medicare understand their rights and benefits, navigate the Medicare system, and secure the quality healthcare they deserve   SCAT: 336-333-6589 [place] [employer]'s shared-ride transportation service for eligible riders who have a disability that prevents them from riding the fixed route bus.     For additional information on assistance programs please contact our social worker:   Abigail Elmore:  [phone]  

## 2018-09-13 ENCOUNTER — Telehealth: Payer: Self-pay

## 2018-09-13 NOTE — Telephone Encounter (Signed)
Spoke with patient concerning his upcoming appointment. Per 1/16 los mailed a letter with a calender enclosed

## 2018-11-08 NOTE — Progress Notes (Signed)
HEMATOLOGY/ONCOLOGY CLINIC NOTE  Date of Service:  11/11/18    Patient Care Team: Christain Sacramento, MD as PCP - General (Family Medicine)  CHIEF COMPLAINTS/PURPOSE OF CONSULTATION:  F/u for SMZL  HISTORY OF PRESENTING ILLNESS:  Jared Nixon is a wonderful 68 y.o. male who has been referred to [place] by Dr Redmond Pulling, [name], MD for evaluation and management of new splenomegaly with lymphocytosis.  Initially, the patient presented into his PCP's office on 06/11/17 complaining of a small lump under his left lower ribcage which had been present for several weeks. This was palpable, per his PCP's notes and Dr Redmond Pulling subsequently ordered a CT A/P to evaluate this further. This was performed on 06/13/17 which confirmed the presence of splenomegaly and portal caval lymphadenopathy which was concerning for lymphoproliferative disease. He also had lab work (summarized as below) which also showed some gradual leukocytosis/lymphocytosis, with worsening anemia and thrombocytopenia. He denies any pain over the area of his splenomegaly, difficulty eating or premature satiation.   In a general sense, he had not had an significant chronic health issues which he is being treated for. He does struggle with some tinnitus involving only the left ear which began around the end of July and has somewhat worsened since. One year ago he also had bilateral inguinal hernia surgery and a basal cell carcinoma excision. He also notes in the 80s that he had a gynecomastia removed, but otherwise of recently he has been feeling at his baseline state-of-health. He was initially informed that he suffered from leukocytosis earlier this year by Dr Dois Davenport office.   He did smoke for a brief time several decades ago in his 45s (0.5ppd), but none since. He does drink alcohol, and on average he drinks 7-8 beers a week. He denies any chemical/radiaiton exposures or exposure during his career. He does not partake in illicit drug usage. He has  never previously needed a blood transfusion in the past.   He is currently prescribed Clonazepam and Ritalin to use on a daily basis which are both managed by Christain Sacramento, MD. He takes these for dealing with clutter/hoarding issues ongoing within his personal life.   Of note, the patient reports that his father did have bladder cancer. His paternal Aunts all were diagnosed with cancer, but he is unsure of which types. He does have a h/o CVA on his mothers side, but nothing else of note.   On ROS, he does report a 10lbs weight loss which was observed by Dr Redmond Pulling and was attributed to his usage of Ritalin. He also notes that he does suffer from sleeping issues. He does experience fatigue intermittently, however, this is not debilitating or hindering his daily activities. He denies fever, chills, night sweats, abdominal pain, nausea, vomiting, diarrhea, rash, or any other associated symptoms.   INTERVAL HISTORY   Jared Nixon is here for f/u of his SMZL presenting with mild asymptomatic anemia and thrombocytopenia. The patient's last visit with [place] was on 09/12/18. The pt reports that he is doing well overall.   The pt reports that he has continued on amlodipine for his Raynaud's which has continued to be helpful. The pt denies any concerns for infections at this time. The pt denies developing fevers, chills, unexpected weight loss, or noticing any new lumps or bumps.   Lab results today (11/11/18) of CBC w/diff and CMP is as follows: all values are WNL except for PLT at 137k, Glucose at 117. 11/11/18 LDH is .  Lab Results  Component Value Date   LDH 163 11/11/2018   On review of systems, pt reports eating well, stable energy levels, and denies concerns for infections, abdominal pains, fevers, chills, unexpected weight loss, noticing any new lumps or bumps, leg swelling, and any other symptoms.   MEDICAL HISTORY:  Past Medical History:  Diagnosis Date   Sinus complaint    Skin cancer      SURGICAL HISTORY: Past Surgical History:  Procedure Laterality Date   basal cell carcinoma surgery     CATARACT EXTRACTION      SOCIAL HISTORY: Social History   Socioeconomic History   Marital status: Single    Spouse name: Not on file   Number of children: Not on file   Years of education: Not on file   Highest education level: Not on file  Occupational History   Not on file  Social Needs   Financial resource strain: Not on file   Food insecurity:    Worry: Not on file    Inability: Not on file   Transportation needs:    Medical: Not on file    Non-medical: Not on file  Tobacco Use   Smoking status: Never Smoker   Smokeless tobacco: Never Used  Substance and Sexual Activity   Alcohol use: Yes    Comment: occasional   Drug use: No   Sexual activity: Not on file  Lifestyle   Physical activity:    Days per week: Not on file    Minutes per session: Not on file   Stress: Not on file  Relationships   Social connections:    Talks on phone: Not on file    Gets together: Not on file    Attends religious service: Not on file    Active member of club or organization: Not on file    Attends meetings of clubs or organizations: Not on file    Relationship status: Not on file   Intimate partner violence:    Fear of current or ex partner: Not on file    Emotionally abused: Not on file    Physically abused: Not on file    Forced sexual activity: Not on file  Other Topics Concern   Not on file  Social History Narrative   Not on file   He is originally from [place], [place].   FAMILY HISTORY: No family history on file.  ALLERGIES:  is allergic to penicillins.  MEDICATIONS:  Current Outpatient Medications  Medication Sig Dispense Refill   amLODipine (NORVASC) 5 MG tablet Take 1 tablet (5 mg total) by mouth daily. 30 tablet 0   ClonazePAM (KLONOPIN PO) Take 1 mg by mouth 2 (two) times daily.      diclofenac (VOLTAREN) 25 MG EC tablet  Take by mouth 2 (two) times daily.     fluticasone (FLONASE) 50 MCG/ACT nasal spray Place 1 spray into both nostrils daily.     methylphenidate (RITALIN) 10 MG tablet Take 10 mg 3 (three) times daily by mouth.      ondansetron (ZOFRAN ODT) 4 MG disintegrating tablet Take 1 tablet (4 mg total) by mouth every 8 (eight) hours as needed for nausea or vomiting. (Patient not taking: Reported on 11/27/2017) 20 tablet 0   No current facility-administered medications for this visit.     REVIEW OF SYSTEMS:    A 10+ POINT REVIEW OF SYSTEMS WAS OBTAINED including neurology, dermatology, psychiatry, cardiac, respiratory, lymph, extremities, GI, GU, Musculoskeletal, constitutional, breasts, reproductive, HEENT.  All pertinent positives are noted in the HPI.  All others are negative.   PHYSICAL EXAMINATION: ECOG PERFORMANCE STATUS: 1 - Symptomatic but completely ambulatory  VS stable stable. Reviewed in EPIC.  GENERAL:alert, in no acute distress and comfortable SKIN: no acute rashes, no significant lesions EYES: conjunctiva are pink and non-injected, sclera anicteric OROPHARYNX: MMM, no exudates, no oropharyngeal erythema or ulceration NECK: supple, no JVD LYMPH:  no palpable lymphadenopathy in the cervical, axillary or inguinal regions LUNGS: clear to auscultation b/l with normal respiratory effort HEART: regular rate & rhythm ABDOMEN:  normoactive bowel sounds , non tender, not distended. No palpable hepatosplenomegaly.  Extremity: no pedal edema PSYCH: alert & oriented x 3 with fluent speech NEURO: no focal motor/sensory deficits   LABORATORY DATA:  I have reviewed the data as listed  . CBC Latest Ref Rng & Units 11/11/2018 09/12/2018 07/18/2018  WBC 4.0 - 10.5 K/uL 5.8 6.3 6.8  Hemoglobin 13.0 - 17.0 g/dL 14.2 14.2 13.7  Hematocrit 39.0 - 52.0 % 41.5 40.5 40.0  Platelets 150 - 400 K/uL 137(L) 137(L) 134(L)   CBC    Component Value Date/Time   WBC 5.8 11/11/2018 0939   RBC 4.53  11/11/2018 0939   HGB 14.2 11/11/2018 0939   HGB 13.9 02/25/2018 1312   HGB 11.0 (L) 08/31/2017 1101   HCT 41.5 11/11/2018 0939   HCT 33.8 (L) 08/31/2017 1101   PLT 137 (L) 11/11/2018 0939   PLT 111 (L) 02/25/2018 1312   PLT 84 (L) 08/31/2017 1101   MCV 91.6 11/11/2018 0939   MCV 91.1 08/31/2017 1101   MCH 31.3 11/11/2018 0939   MCHC 34.2 11/11/2018 0939   RDW 15.4 11/11/2018 0939   RDW 17.3 (H) 08/31/2017 1101   LYMPHSABS 1.7 11/11/2018 0939   LYMPHSABS 11.4 (H) 08/31/2017 1101   MONOABS 0.5 11/11/2018 0939   MONOABS 0.7 08/31/2017 1101   EOSABS 0.1 11/11/2018 0939   EOSABS 0.1 08/31/2017 1101   BASOSABS 0.0 11/11/2018 0939   BASOSABS 0.0 08/31/2017 1101    . CMP Latest Ref Rng & Units 11/11/2018 09/12/2018 07/18/2018  Glucose 70 - 99 mg/dL 117(H) 130(H) 121(H)  BUN 8 - 23 mg/dL 21 20 17   Creatinine 0.61 - 1.24 mg/dL 0.91 0.90 0.88  Sodium 135 - 145 mmol/L 142 141 142  Potassium 3.5 - 5.1 mmol/L 3.9 4.1 3.9  Chloride 98 - 111 mmol/L 107 106 107  CO2 22 - 32 mmol/L 23 24 23   Calcium 8.9 - 10.3 mg/dL 9.2 9.5 9.5  Total Protein 6.5 - 8.1 g/dL 7.8 7.9 7.9  Total Bilirubin 0.3 - 1.2 mg/dL 0.7 0.7 0.6  Alkaline Phos 38 - 126 U/L 62 66 67  AST 15 - 41 U/L 25 25 23   ALT 0 - 44 U/L 38 36 32     -flow cytometry consistent with marginal zone lymphoma vs splenic Lymphoma      RADIOGRAPHIC STUDIES: I have personally reviewed the radiological images as listed and agreed with the findings in the report. No results found.  ASSESSMENT & PLAN:   Jared Nixon is a  68 y.o. male who presents to our clinic to discuss:  #1: Splenic marginal Zone lymphoma  Initially presented with Splenomegaly with worsening leukocytosis/lymphocytosis, anemia, and thrombocytopenia He completed 4/4 cycles of Rituxan 09/11/17-10/02/17.   US abdomen from 11/20/17 with  1. Splenomegaly with splenic volume of 719 cubic cm, compared with PET-CT volume of 1100 cubic cm. 2. Cholelithiasis with no  sonographic evidence for  cholecystitis or biliary dilatation 3. Small cysts in the kidneys 4. Slight increased hepatic echogenicity, possible mild steatosis.   PLAN:  -Discussed pt labwork today, 11/11/18; blood counts and chemistries are stable -The pt has no prohibitive toxicities from continuing his sixth maintenance Rituxan at this time. -The pt shows no clinical or lab progression of SMZL at this time.  -Recommend crowd avoidance and infection prevention strategies -Discussed the recommendation to continue maintenance Rituxan for a maximum of 2 years unless the pt developed any new intolerances -Will se the pt back in 2 months  #2 Elevated BP #3 Raynaud's  Syndrome Plan  -Continue amlodipine 5mg  po daily to treat both his Raynaud's and high BP.  -Encouraged pt to check BP with an arm cuff at home and f/u for BPmx with PCP   RTC with Dr Irene Limbo in 60 days with labs, MD visit, and next maintenance Rituxan infusion   All of the patients questions were answered with apparent satisfaction. The patient knows to call the clinic with any problems, questions or concerns.  The total time spent in the appt was 20 minutes and more than 50% was on counseling and direct patient cares.    Sullivan Lone MD MS AAHIVMS [facility] Hematology/Oncology Physician [facility]  (Office):       [phone]  (Work cell):  [phone] (Fax):           [phone]  11/11/2018 11:09 AM   I, Baldwin Jamaica, am acting as a scribe for Dr. Sullivan Lone.   .I have reviewed the above documentation for accuracy and completeness, and I agree with the above. Brunetta Genera MD

## 2018-11-11 ENCOUNTER — Inpatient Hospital Stay: Payer: Medicare Other

## 2018-11-11 ENCOUNTER — Inpatient Hospital Stay: Payer: Medicare Other | Attending: Hematology | Admitting: Hematology

## 2018-11-11 ENCOUNTER — Telehealth: Payer: Self-pay | Admitting: Hematology

## 2018-11-11 ENCOUNTER — Other Ambulatory Visit: Payer: Self-pay

## 2018-11-11 VITALS — BP 136/70 | HR 72 | Temp 97.9°F | Resp 17

## 2018-11-11 VITALS — BP 160/78 | HR 76 | Temp 97.9°F | Resp 18 | Ht 71.0 in | Wt 186.4 lb

## 2018-11-11 DIAGNOSIS — Z79899 Other long term (current) drug therapy: Secondary | ICD-10-CM | POA: Insufficient documentation

## 2018-11-11 DIAGNOSIS — C8307 Small cell B-cell lymphoma, spleen: Secondary | ICD-10-CM

## 2018-11-11 DIAGNOSIS — Z5112 Encounter for antineoplastic immunotherapy: Secondary | ICD-10-CM | POA: Insufficient documentation

## 2018-11-11 DIAGNOSIS — Z85828 Personal history of other malignant neoplasm of skin: Secondary | ICD-10-CM | POA: Insufficient documentation

## 2018-11-11 DIAGNOSIS — I7300 Raynaud's syndrome without gangrene: Secondary | ICD-10-CM

## 2018-11-11 DIAGNOSIS — Z7189 Other specified counseling: Secondary | ICD-10-CM

## 2018-11-11 LAB — CBC WITH DIFFERENTIAL/PLATELET
Abs Immature Granulocytes: 0.02 10*3/uL (ref 0.00–0.07)
Basophils Absolute: 0 10*3/uL (ref 0.0–0.1)
Basophils Relative: 1 %
EOS ABS: 0.1 10*3/uL (ref 0.0–0.5)
Eosinophils Relative: 2 %
HCT: 41.5 % (ref 39.0–52.0)
Hemoglobin: 14.2 g/dL (ref 13.0–17.0)
Immature Granulocytes: 0 %
Lymphocytes Relative: 29 %
Lymphs Abs: 1.7 10*3/uL (ref 0.7–4.0)
MCH: 31.3 pg (ref 26.0–34.0)
MCHC: 34.2 g/dL (ref 30.0–36.0)
MCV: 91.6 fL (ref 80.0–100.0)
Monocytes Absolute: 0.5 10*3/uL (ref 0.1–1.0)
Monocytes Relative: 9 %
Neutro Abs: 3.4 10*3/uL (ref 1.7–7.7)
Neutrophils Relative %: 59 %
Platelets: 137 10*3/uL — ABNORMAL LOW (ref 150–400)
RBC: 4.53 MIL/uL (ref 4.22–5.81)
RDW: 15.4 % (ref 11.5–15.5)
WBC: 5.8 10*3/uL (ref 4.0–10.5)
nRBC: 0 % (ref 0.0–0.2)

## 2018-11-11 LAB — CMP (CANCER CENTER ONLY)
ALT: 38 U/L (ref 0–44)
AST: 25 U/L (ref 15–41)
Albumin: 4.2 g/dL (ref 3.5–5.0)
Alkaline Phosphatase: 62 U/L (ref 38–126)
Anion gap: 12 (ref 5–15)
BUN: 21 mg/dL (ref 8–23)
CO2: 23 mmol/L (ref 22–32)
Calcium: 9.2 mg/dL (ref 8.9–10.3)
Chloride: 107 mmol/L (ref 98–111)
Creatinine: 0.91 mg/dL (ref 0.61–1.24)
GFR, Est AFR Am: >60 mL/min (ref >60)
Glucose, Bld: 117 mg/dL — ABNORMAL HIGH (ref 70–99)
POTASSIUM: 3.9 mmol/L (ref 3.5–5.1)
Sodium: 142 mmol/L (ref 135–145)
TOTAL PROTEIN: 7.8 g/dL (ref 6.5–8.1)
Total Bilirubin: 0.7 mg/dL (ref 0.3–1.2)

## 2018-11-11 LAB — LACTATE DEHYDROGENASE: LDH: 163 U/L (ref 98–192)

## 2018-11-11 MED ORDER — SODIUM CHLORIDE 0.9 % IV SOLN
Freq: Once | INTRAVENOUS | Status: AC
  Administered 2018-11-11: 12:00:00 via INTRAVENOUS
  Filled 2018-11-11: qty 250

## 2018-11-11 MED ORDER — SODIUM CHLORIDE 0.9 % IV SOLN
375.0000 mg/m2 | Freq: Once | INTRAVENOUS | Status: AC
  Administered 2018-11-11: 700 mg via INTRAVENOUS
  Filled 2018-11-11: qty 50

## 2018-11-11 MED ORDER — SODIUM CHLORIDE 0.9 % IV SOLN
20.0000 mg | Freq: Once | INTRAVENOUS | Status: AC
  Administered 2018-11-11: 20 mg via INTRAVENOUS
  Filled 2018-11-11: qty 2

## 2018-11-11 MED ORDER — METHYLPREDNISOLONE SODIUM SUCC 125 MG IJ SOLR
125.0000 mg | Freq: Every day | INTRAMUSCULAR | Status: AC
  Administered 2018-11-11: 125 mg via INTRAVENOUS

## 2018-11-11 MED ORDER — FAMOTIDINE IN NACL 20-0.9 MG/50ML-% IV SOLN: 20.0000 mg | Freq: Once | INTRAVENOUS | Status: DC

## 2018-11-11 MED ORDER — ACETAMINOPHEN 325 MG PO TABS
ORAL_TABLET | ORAL | Status: AC
  Filled 2018-11-11: qty 2

## 2018-11-11 MED ORDER — DIPHENHYDRAMINE HCL 25 MG PO CAPS
50.0000 mg | ORAL_CAPSULE | Freq: Once | ORAL | Status: AC
  Administered 2018-11-11: 50 mg via ORAL

## 2018-11-11 MED ORDER — DIPHENHYDRAMINE HCL 25 MG PO CAPS
ORAL_CAPSULE | ORAL | Status: AC
  Filled 2018-11-11: qty 2

## 2018-11-11 MED ORDER — ACETAMINOPHEN 325 MG PO TABS
650.0000 mg | ORAL_TABLET | Freq: Once | ORAL | Status: AC
  Administered 2018-11-11: 650 mg via ORAL

## 2018-11-11 MED ORDER — METHYLPREDNISOLONE SODIUM SUCC 125 MG IJ SOLR
INTRAMUSCULAR | Status: AC
  Filled 2018-11-11: qty 2

## 2018-11-11 NOTE — Patient Instructions (Signed)
[facility] °Discharge Instructions for Patients Receiving Chemotherapy ° °Today you received the following chemotherapy agents: Rituximab (Rituxan) ° °To help prevent nausea and vomiting after your treatment, we encourage you to take your nausea medication as directed.  °  °If you develop nausea and vomiting that is not controlled by your nausea medication, call the clinic.  ° °BELOW ARE SYMPTOMS THAT SHOULD BE REPORTED IMMEDIATELY: °· *FEVER GREATER THAN 100.5 F °· *CHILLS WITH OR WITHOUT FEVER °· NAUSEA AND VOMITING THAT IS NOT CONTROLLED WITH YOUR NAUSEA MEDICATION °· *UNUSUAL SHORTNESS OF BREATH °· *UNUSUAL BRUISING OR BLEEDING °· TENDERNESS IN MOUTH AND THROAT WITH OR WITHOUT PRESENCE OF ULCERS °· *URINARY PROBLEMS °· *BOWEL PROBLEMS °· UNUSUAL RASH °Items with * indicate a potential emergency and should be followed up as soon as possible. ° °Feel free to call the clinic should you have any questions or concerns. The clinic phone number is [phone]. ° °Please show the CHEMO ALERT CARD at check-in to the Emergency Department and triage nurse. ° ° °

## 2018-11-11 NOTE — Telephone Encounter (Signed)
Per 3/16 los, appts already scheduled.

## 2019-01-09 NOTE — Progress Notes (Signed)
HEMATOLOGY/ONCOLOGY CLINIC NOTE  Date of Service:  01/10/19    Patient Care Team: Jared Sacramento, MD as PCP - General (Family Medicine)  CHIEF COMPLAINTS/PURPOSE OF CONSULTATION:  F/u for SMZL  HISTORY OF PRESENTING ILLNESS:  Jared Nixon is a wonderful 68 y.o. male who has been referred to [place] by Jared Nixon, Jared Flavors, MD for evaluation and management of new splenomegaly with lymphocytosis.  Initially, the patient presented into his PCP's office on 06/11/17 complaining of a small lump under his left lower ribcage which had been present for several weeks. This was palpable, per his PCP's notes and Jared Nixon subsequently ordered a CT A/P to evaluate this further. This was performed on 06/13/17 which confirmed the presence of splenomegaly and portal caval lymphadenopathy which was concerning for lymphoproliferative disease. He also had lab work (summarized as below) which also showed some gradual leukocytosis/lymphocytosis, with worsening anemia and thrombocytopenia. He denies any pain over the area of his splenomegaly, difficulty eating or premature satiation.   In a general sense, he had not had an significant chronic health issues which he is being treated for. He does struggle with some tinnitus involving only the left ear which began around the end of July and has somewhat worsened since. One year ago he also had bilateral inguinal hernia surgery and a basal cell carcinoma excision. He also notes in the 80s that he had a gynecomastia removed, but otherwise of recently he has been feeling at his baseline state-of-health. He was initially informed that he suffered from leukocytosis earlier this year by Jared Nixon office.   He did smoke for a brief time several decades ago in his 58s (0.5ppd), but none since. He does drink alcohol, and on average he drinks 7-8 beers a week. He denies any chemical/radiaiton exposures or exposure during his career. He does not partake in illicit drug usage. He has  never previously needed a blood transfusion in the past.   He is currently prescribed Clonazepam and Ritalin to use on a daily basis which are both managed by Jared Sacramento, MD. He takes these for dealing with clutter/hoarding issues ongoing within his personal life.   Of note, the patient reports that his father did have bladder cancer. His paternal Aunts all were diagnosed with cancer, but he is unsure of which types. He does have a h/o CVA on his mothers side, but nothing else of note.   On ROS, he does report a 10lbs weight loss which was observed by Jared Nixon and was attributed to his usage of Ritalin. He also notes that he does suffer from sleeping issues. He does experience fatigue intermittently, however, this is not debilitating or hindering his daily activities. He denies fever, chills, night sweats, abdominal pain, nausea, vomiting, diarrhea, rash, or any other associated symptoms.   INTERVAL HISTORY   Jared Nixon is here for management and evaluation of his SMZL. The patient's last visit with [place] was on 11/11/18. The pt reports that he is doing well overall.  The pt reports that he has not developed any new concerns in the interim. He denies fevers, chills, or night sweats. He notes that he is eating well and denies abdominal pains. He denies noticing any new lumps or bumps. The pt endorses good energy levels overall. He notes that his Raynaud's symptoms have been better with the warmer better but his fingers still get cold from time to time.  Lab results today (01/10/19) of CBC w/diff and  CMP is as follows: all values are WNL except for PLT at 127k, Glucose at 116. 01/10/19 LDH at 139  On review of systems, pt reports good energy levels, eating well, stable weight, and denies concerns for infections, abdominal pains, fevers, chills, night sweats, unexpected weight loss, new lumps or bumps, and any other symptoms.    MEDICAL HISTORY:  Past Medical History:  Diagnosis Date  . Sinus  complaint   . Skin cancer     SURGICAL HISTORY: Past Surgical History:  Procedure Laterality Date  . basal cell carcinoma surgery    . CATARACT EXTRACTION      SOCIAL HISTORY: Social History   Socioeconomic History  . Marital status: Single    Spouse name: Not on file  . Number of children: Not on file  . Years of education: Not on file  . Highest education level: Not on file  Occupational History  . Not on file  Social Needs  . Financial resource strain: Not on file  . Food insecurity:    Worry: Not on file    Inability: Not on file  . Transportation needs:    Medical: Not on file    Non-medical: Not on file  Tobacco Use  . Smoking status: Never Smoker  . Smokeless tobacco: Never Used  Substance and Sexual Activity  . Alcohol use: Yes    Comment: occasional  . Drug use: No  . Sexual activity: Not on file  Lifestyle  . Physical activity:    Days per week: Not on file    Minutes per session: Not on file  . Stress: Not on file  Relationships  . Social connections:    Talks on phone: Not on file    Gets together: Not on file    Attends religious service: Not on file    Active member of club or organization: Not on file    Attends meetings of clubs or organizations: Not on file    Relationship status: Not on file  . Intimate partner violence:    Fear of current or ex partner: Not on file    Emotionally abused: Not on file    Physically abused: Not on file    Forced sexual activity: Not on file  Other Topics Concern  . Not on file  Social History Narrative  . Not on file   He is originally from [place], [place].   FAMILY HISTORY: No family history on file.  ALLERGIES:  is allergic to penicillins.  MEDICATIONS:  Current Outpatient Medications  Medication Sig Dispense Refill  . amLODipine (NORVASC) 5 MG tablet Take 1 tablet (5 mg total) by mouth daily. 30 tablet 0  . ClonazePAM (KLONOPIN PO) Take 1 mg by mouth 2 (two) times daily.     . diclofenac  (VOLTAREN) 25 MG EC tablet Take by mouth 2 (two) times daily.    . fluticasone (FLONASE) 50 MCG/ACT nasal spray Place 1 spray into both nostrils daily.    . methylphenidate (RITALIN) 10 MG tablet Take 10 mg 3 (three) times daily by mouth.     . ondansetron (ZOFRAN ODT) 4 MG disintegrating tablet Take 1 tablet (4 mg total) by mouth every 8 (eight) hours as needed for nausea or vomiting. (Patient not taking: Reported on 11/27/2017) 20 tablet 0   No current facility-administered medications for this visit.     REVIEW OF SYSTEMS:    A 10+ POINT REVIEW OF SYSTEMS WAS OBTAINED including neurology, dermatology, psychiatry, cardiac, respiratory, lymph,  extremities, GI, GU, Musculoskeletal, constitutional, breasts, reproductive, HEENT.  All pertinent positives are noted in the HPI.  All others are negative.   PHYSICAL EXAMINATION: ECOG PERFORMANCE STATUS: 1 - Symptomatic but completely ambulatory  VS stable stable. Reviewed in EPIC.  GENERAL:alert, in no acute distress and comfortable SKIN: no acute rashes, no significant lesions EYES: conjunctiva are pink and non-injected, sclera anicteric OROPHARYNX: MMM, no exudates, no oropharyngeal erythema or ulceration NECK: supple, no JVD LYMPH:  no palpable lymphadenopathy in the cervical, axillary or inguinal regions LUNGS: clear to auscultation b/l with normal respiratory effort HEART: regular rate & rhythm ABDOMEN:  normoactive bowel sounds , non tender, not distended. No palpable hepatosplenomegaly.  Extremity: no pedal edema PSYCH: alert & oriented x 3 with fluent speech NEURO: no focal motor/sensory deficits   LABORATORY DATA:  I have reviewed the data as listed  . CBC Latest Ref Rng & Units 01/10/2019 11/11/2018 09/12/2018  WBC 4.0 - 10.5 K/uL 5.0 5.8 6.3  Hemoglobin 13.0 - 17.0 g/dL 14.1 14.2 14.2  Hematocrit 39.0 - 52.0 % 41.0 41.5 40.5  Platelets 150 - 400 K/uL 127(L) 137(L) 137(L)   CBC    Component Value Date/Time   WBC 5.0  01/10/2019 1041   RBC 4.36 01/10/2019 1041   HGB 14.1 01/10/2019 1041   HGB 13.9 02/25/2018 1312   HGB 11.0 (L) 08/31/2017 1101   HCT 41.0 01/10/2019 1041   HCT 33.8 (L) 08/31/2017 1101   PLT 127 (L) 01/10/2019 1041   PLT 111 (L) 02/25/2018 1312   PLT 84 (L) 08/31/2017 1101   MCV 94.0 01/10/2019 1041   MCV 91.1 08/31/2017 1101   MCH 32.3 01/10/2019 1041   MCHC 34.4 01/10/2019 1041   RDW 15.2 01/10/2019 1041   RDW 17.3 (H) 08/31/2017 1101   LYMPHSABS 1.3 01/10/2019 1041   LYMPHSABS 11.4 (H) 08/31/2017 1101   MONOABS 0.4 01/10/2019 1041   MONOABS 0.7 08/31/2017 1101   EOSABS 0.1 01/10/2019 1041   EOSABS 0.1 08/31/2017 1101   BASOSABS 0.0 01/10/2019 1041   BASOSABS 0.0 08/31/2017 1101    . CMP Latest Ref Rng & Units 01/10/2019 11/11/2018 09/12/2018  Glucose 70 - 99 mg/dL 116(H) 117(H) 130(H)  BUN 8 - 23 mg/dL 18 21 20   Creatinine 0.61 - 1.24 mg/dL 0.95 0.91 0.90  Sodium 135 - 145 mmol/L 141 142 141  Potassium 3.5 - 5.1 mmol/L 3.8 3.9 4.1  Chloride 98 - 111 mmol/L 107 107 106  CO2 22 - 32 mmol/L 25 23 24   Calcium 8.9 - 10.3 mg/dL 8.9 9.2 9.5  Total Protein 6.5 - 8.1 g/dL 7.5 7.8 7.9  Total Bilirubin 0.3 - 1.2 mg/dL 0.6 0.7 0.7  Alkaline Phos 38 - 126 U/L 55 62 66  AST 15 - 41 U/L 24 25 25   ALT 0 - 44 U/L 37 38 36     -flow cytometry consistent with marginal zone lymphoma vs splenic Lymphoma      RADIOGRAPHIC STUDIES: I have personally reviewed the radiological images as listed and agreed with the findings in the report. No results found.  ASSESSMENT & PLAN:   DIETRICH SAMUELSON is a  68 y.o. male who presents to our clinic to discuss:  #1: Splenic marginal Zone lymphoma  Initially presented with Splenomegaly with worsening leukocytosis/lymphocytosis, anemia, and thrombocytopenia He completed 4/4 cycles of Rituxan 09/11/17-10/02/17.   US abdomen from 11/20/17 revealed Splenomegaly with splenic volume of 719 cubic cm, compared with PET-CT volume of 1100 cubic cm. 2.  Cholelithiasis with no sonographic evidence for cholecystitis or biliary dilatation 3. Small cysts in the kidneys 4. Slight increased hepatic echogenicity, possible mild steatosis.  S/p 6 maintenance cycles of Rituxan every 2 months, completed March 2020   PLAN:  -Discussed pt labwork today, 01/10/19; blood counts and chemistries are stable. LDH is normal and stable at 139. -Will continue to watch mild thrombocytopenia, could be element of hypersplenism. -The pt shows no clinical or lab progression of SMZL at this time.  -The pt has no prohibitive toxicities from continuing his seventh cycle of maintenance Rituxan at this time. -Discussed the recommendation to continue maintenance Rituxan for a maximum of 2 years unless the pt developed any new intolerances. However, discussed that in the current setting of the Covid-19 pandemic, the pt could reconsider this recommendation, noting that Rituxan does affect his immune system, weighing risks vs benefits of this. -Pt notes that he would like to complete this seventh cycle of maintenance Rituxan today, and then hold from continuing maintenance Rituxan every 2 months, which is not unreasonable. -Advised infection prevention strategies, crowd avoidance, and frequent handwashing -Will see the pt back in 3 months   #2 Elevated BP #3 Raynaud's  Syndrome Plan  -Continue amlodipine 5mg  po daily to treat both his Raynaud's and high BP.  -Encouraged pt to check BP with an arm cuff at home and f/u for BPmx with PCP   Holding further maintenance Rituxan per patient preference. RTC with Jared Irene Limbo with labs in 3 months   All of the patients questions were answered with apparent satisfaction. The patient knows to call the clinic with any problems, questions or concerns.  The total time spent in the appt was 25 minutes and more than 50% was on counseling and direct patient cares.    Sullivan Lone MD MS AAHIVMS [facility] - Inpatient Hematology/Oncology Physician [facility]  (Office):       [phone]  (Work cell):  [phone] (Fax):           [phone]  01/10/2019 12:10 PM   I, Baldwin Jamaica, am acting as a scribe for Jared. Sullivan Lone.   .I have reviewed the above documentation for accuracy and completeness, and I agree with the above. Brunetta Genera MD

## 2019-01-10 ENCOUNTER — Other Ambulatory Visit: Payer: Self-pay

## 2019-01-10 ENCOUNTER — Telehealth: Payer: Self-pay | Admitting: Hematology

## 2019-01-10 ENCOUNTER — Inpatient Hospital Stay: Payer: Medicare Other

## 2019-01-10 ENCOUNTER — Inpatient Hospital Stay: Payer: Medicare Other | Attending: Hematology | Admitting: Hematology

## 2019-01-10 VITALS — BP 139/73 | HR 88 | Temp 99.2°F | Resp 18 | Ht 71.0 in | Wt 180.8 lb

## 2019-01-10 VITALS — BP 132/72 | HR 68 | Temp 97.7°F | Resp 18

## 2019-01-10 DIAGNOSIS — Z5112 Encounter for antineoplastic immunotherapy: Secondary | ICD-10-CM

## 2019-01-10 DIAGNOSIS — Z85828 Personal history of other malignant neoplasm of skin: Secondary | ICD-10-CM | POA: Diagnosis not present

## 2019-01-10 DIAGNOSIS — Z7189 Other specified counseling: Secondary | ICD-10-CM

## 2019-01-10 DIAGNOSIS — H9312 Tinnitus, left ear: Secondary | ICD-10-CM | POA: Diagnosis not present

## 2019-01-10 DIAGNOSIS — Z8052 Family history of malignant neoplasm of bladder: Secondary | ICD-10-CM | POA: Diagnosis not present

## 2019-01-10 DIAGNOSIS — Z79899 Other long term (current) drug therapy: Secondary | ICD-10-CM | POA: Diagnosis not present

## 2019-01-10 DIAGNOSIS — F17210 Nicotine dependence, cigarettes, uncomplicated: Secondary | ICD-10-CM

## 2019-01-10 DIAGNOSIS — R030 Elevated blood-pressure reading, without diagnosis of hypertension: Secondary | ICD-10-CM | POA: Insufficient documentation

## 2019-01-10 DIAGNOSIS — R5383 Other fatigue: Secondary | ICD-10-CM | POA: Diagnosis not present

## 2019-01-10 DIAGNOSIS — G4700 Insomnia, unspecified: Secondary | ICD-10-CM

## 2019-01-10 DIAGNOSIS — C8307 Small cell B-cell lymphoma, spleen: Secondary | ICD-10-CM

## 2019-01-10 DIAGNOSIS — I7300 Raynaud's syndrome without gangrene: Secondary | ICD-10-CM | POA: Diagnosis not present

## 2019-01-10 DIAGNOSIS — Z809 Family history of malignant neoplasm, unspecified: Secondary | ICD-10-CM | POA: Diagnosis not present

## 2019-01-10 LAB — CBC WITH DIFFERENTIAL/PLATELET
Abs Immature Granulocytes: 0.02 10*3/uL (ref 0.00–0.07)
Basophils Absolute: 0 10*3/uL (ref 0.0–0.1)
Basophils Relative: 1 %
Eosinophils Absolute: 0.1 10*3/uL (ref 0.0–0.5)
Eosinophils Relative: 2 %
HCT: 41 % (ref 39.0–52.0)
Hemoglobin: 14.1 g/dL (ref 13.0–17.0)
Immature Granulocytes: 0 %
Lymphocytes Relative: 27 %
Lymphs Abs: 1.3 10*3/uL (ref 0.7–4.0)
MCH: 32.3 pg (ref 26.0–34.0)
MCHC: 34.4 g/dL (ref 30.0–36.0)
MCV: 94 fL (ref 80.0–100.0)
Monocytes Absolute: 0.4 10*3/uL (ref 0.1–1.0)
Monocytes Relative: 8 %
Neutro Abs: 3.1 10*3/uL (ref 1.7–7.7)
Neutrophils Relative %: 62 %
Platelets: 127 10*3/uL — ABNORMAL LOW (ref 150–400)
RBC: 4.36 MIL/uL (ref 4.22–5.81)
RDW: 15.2 % (ref 11.5–15.5)
WBC: 5 10*3/uL (ref 4.0–10.5)
nRBC: 0 % (ref 0.0–0.2)

## 2019-01-10 LAB — CMP (CANCER CENTER ONLY)
ALT: 37 U/L (ref 0–44)
AST: 24 U/L (ref 15–41)
Albumin: 4.1 g/dL (ref 3.5–5.0)
Alkaline Phosphatase: 55 U/L (ref 38–126)
Anion gap: 9 (ref 5–15)
BUN: 18 mg/dL (ref 8–23)
CO2: 25 mmol/L (ref 22–32)
Calcium: 8.9 mg/dL (ref 8.9–10.3)
Chloride: 107 mmol/L (ref 98–111)
Creatinine: 0.95 mg/dL (ref 0.61–1.24)
GFR, Est AFR Am: >60 mL/min (ref >60)
GFR, Estimated: >60 mL/min (ref >60)
Glucose, Bld: 116 mg/dL — ABNORMAL HIGH (ref 70–99)
Potassium: 3.8 mmol/L (ref 3.5–5.1)
Sodium: 141 mmol/L (ref 135–145)
Total Bilirubin: 0.6 mg/dL (ref 0.3–1.2)
Total Protein: 7.5 g/dL (ref 6.5–8.1)

## 2019-01-10 LAB — LACTATE DEHYDROGENASE: LDH: 139 U/L (ref 98–192)

## 2019-01-10 MED ORDER — FAMOTIDINE IN NACL 20-0.9 MG/50ML-% IV SOLN
20.0000 mg | Freq: Once | INTRAVENOUS | Status: AC
  Administered 2019-01-10: 20 mg via INTRAVENOUS

## 2019-01-10 MED ORDER — FAMOTIDINE IN NACL 20-0.9 MG/50ML-% IV SOLN
INTRAVENOUS | Status: AC
  Filled 2019-01-10: qty 50

## 2019-01-10 MED ORDER — METHYLPREDNISOLONE SODIUM SUCC 125 MG IJ SOLR
INTRAMUSCULAR | Status: AC
  Filled 2019-01-10: qty 2

## 2019-01-10 MED ORDER — DIPHENHYDRAMINE HCL 25 MG PO CAPS
50.0000 mg | ORAL_CAPSULE | Freq: Once | ORAL | Status: AC
  Administered 2019-01-10: 50 mg via ORAL

## 2019-01-10 MED ORDER — METHYLPREDNISOLONE SODIUM SUCC 125 MG IJ SOLR
125.0000 mg | Freq: Every day | INTRAMUSCULAR | Status: AC
  Administered 2019-01-10: 125 mg via INTRAVENOUS

## 2019-01-10 MED ORDER — SODIUM CHLORIDE 0.9 % IV SOLN
Freq: Once | INTRAVENOUS | Status: AC
  Administered 2019-01-10: 13:00:00 via INTRAVENOUS
  Filled 2019-01-10: qty 250

## 2019-01-10 MED ORDER — ACETAMINOPHEN 325 MG PO TABS
650.0000 mg | ORAL_TABLET | Freq: Once | ORAL | Status: AC
  Administered 2019-01-10: 650 mg via ORAL

## 2019-01-10 MED ORDER — SODIUM CHLORIDE 0.9 % IV SOLN
375.0000 mg/m2 | Freq: Once | INTRAVENOUS | Status: AC
  Administered 2019-01-10: 700 mg via INTRAVENOUS
  Filled 2019-01-10: qty 20

## 2019-01-10 MED ORDER — ACETAMINOPHEN 325 MG PO TABS
ORAL_TABLET | ORAL | Status: AC
  Filled 2019-01-10: qty 2

## 2019-01-10 MED ORDER — DIPHENHYDRAMINE HCL 25 MG PO CAPS
ORAL_CAPSULE | ORAL | Status: AC
  Filled 2019-01-10: qty 2

## 2019-01-10 NOTE — Patient Instructions (Signed)
[facility] Discharge Instructions for Patients Receiving Chemotherapy  Today you received the following chemotherapy agents Rituximab (RITUXAN).  To help prevent nausea and vomiting after your treatment, we encourage you to take your nausea medication as prescribed.   If you develop nausea and vomiting that is not controlled by your nausea medication, call the clinic.   BELOW ARE SYMPTOMS THAT SHOULD BE REPORTED IMMEDIATELY:  *FEVER GREATER THAN 100.5 F  *CHILLS WITH OR WITHOUT FEVER  NAUSEA AND VOMITING THAT IS NOT CONTROLLED WITH YOUR NAUSEA MEDICATION  *UNUSUAL SHORTNESS OF BREATH  *UNUSUAL BRUISING OR BLEEDING  TENDERNESS IN MOUTH AND THROAT WITH OR WITHOUT PRESENCE OF ULCERS  *URINARY PROBLEMS  *BOWEL PROBLEMS  UNUSUAL RASH Items with * indicate a potential emergency and should be followed up as soon as possible.  Feel free to call the clinic should you have any questions or concerns. The clinic phone number is [phone].  Please show the CHEMO ALERT CARD at check-in to the Emergency Department and triage nurse.  Coronavirus (COVID-19) Are you at risk?  Are you at risk for the Coronavirus (COVID-19)?  To be considered HIGH RISK for Coronavirus (COVID-19), you have to meet the following criteria:  . Traveled to [place], [place], [place], [place] or [place]; or in the [place] to [place], [place], [place], or [place]; and have fever, cough, and shortness of breath within the last 2 weeks of travel OR . Been in close contact with a person diagnosed with COVID-19 within the last 2 weeks and have fever, cough, and shortness of breath . IF YOU DO NOT MEET THESE CRITERIA, YOU ARE CONSIDERED LOW RISK FOR COVID-19.  What to do if you are HIGH RISK for COVID-19?  . If you are having a medical emergency, call 911. . Seek medical care right away. Before you go to a doctor's office, urgent care or emergency department, call ahead and tell them  about your recent travel, contact with someone diagnosed with COVID-19, and your symptoms. You should receive instructions from your physician's office regarding next steps of care.  . When you arrive at healthcare provider, tell the healthcare staff immediately you have returned from visiting [place], [place], [place], [place] or [place]; or traveled in the [place] to [place], [place], [place], or [place]; in the last two weeks or you have been in close contact with a person diagnosed with COVID-19 in the last 2 weeks.   . Tell the health care staff about your symptoms: fever, cough and shortness of breath. . After you have been seen by a medical provider, you will be either: o Tested for (COVID-19) and discharged home on quarantine except to seek medical care if symptoms worsen, and asked to  - Stay home and avoid contact with others until you get your results (4-5 days)  - Avoid travel on public transportation if possible (such as bus, train, or airplane) or o Sent to the Emergency Department by EMS for evaluation, COVID-19 testing, and possible admission depending on your condition and test results.  What to do if you are LOW RISK for COVID-19?  Reduce your risk of any infection by using the same precautions used for avoiding the common cold or flu:  . Wash your hands often with soap and warm water for at least 20 seconds.  If soap and water are not readily available, use an alcohol-based hand sanitizer with at least 60% alcohol.  . If coughing or   sneezing, cover your mouth and nose by coughing or sneezing into the elbow areas of your shirt or coat, into a tissue or into your sleeve (not your hands). . Avoid shaking hands with others and consider head nods or verbal greetings only. . Avoid touching your eyes, nose, or mouth with unwashed hands.  . Avoid close contact with people who are sick. . Avoid places or events with large numbers of people in one location, like concerts or  sporting events. . Carefully consider travel plans you have or are making. . If you are planning any travel outside or inside the [place], visit the CDC's Travelers' Health webpage for the latest health notices. . If you have some symptoms but not all symptoms, continue to monitor at home and seek medical attention if your symptoms worsen. . If you are having a medical emergency, call 911.   [place] / e-Visit: [email or web address]         [facility]: [facility]: [phone]                   [facility]: [phone]

## 2019-01-10 NOTE — Telephone Encounter (Signed)
Scheduled appt per 5/15 los. ° °A calendar will be mailed out. °

## 2019-04-03 ENCOUNTER — Telehealth: Payer: Self-pay | Admitting: Hematology

## 2019-04-03 NOTE — Telephone Encounter (Signed)
Called patient regarding rescheduled appointments on 08/14 due to MD being out of office, patient is aware of new appointments.

## 2019-04-11 ENCOUNTER — Ambulatory Visit: Payer: Medicare Other | Admitting: Hematology

## 2019-04-11 ENCOUNTER — Other Ambulatory Visit: Payer: Medicare Other

## 2019-04-16 ENCOUNTER — Inpatient Hospital Stay (HOSPITAL_BASED_OUTPATIENT_CLINIC_OR_DEPARTMENT_OTHER): Payer: Medicare Other | Admitting: Hematology

## 2019-04-16 ENCOUNTER — Inpatient Hospital Stay: Payer: Medicare Other | Attending: Hematology

## 2019-04-16 ENCOUNTER — Other Ambulatory Visit: Payer: Self-pay

## 2019-04-16 VITALS — BP 151/84 | HR 91 | Temp 98.9°F | Resp 18 | Ht 71.0 in | Wt 180.5 lb

## 2019-04-16 DIAGNOSIS — Z85828 Personal history of other malignant neoplasm of skin: Secondary | ICD-10-CM | POA: Diagnosis not present

## 2019-04-16 DIAGNOSIS — Z8052 Family history of malignant neoplasm of bladder: Secondary | ICD-10-CM | POA: Diagnosis not present

## 2019-04-16 DIAGNOSIS — Z9225 Personal history of immunosupression therapy: Secondary | ICD-10-CM | POA: Insufficient documentation

## 2019-04-16 DIAGNOSIS — C8307 Small cell B-cell lymphoma, spleen: Secondary | ICD-10-CM

## 2019-04-16 DIAGNOSIS — H9312 Tinnitus, left ear: Secondary | ICD-10-CM | POA: Diagnosis not present

## 2019-04-16 DIAGNOSIS — I7300 Raynaud's syndrome without gangrene: Secondary | ICD-10-CM | POA: Diagnosis not present

## 2019-04-16 DIAGNOSIS — Z87891 Personal history of nicotine dependence: Secondary | ICD-10-CM | POA: Insufficient documentation

## 2019-04-16 DIAGNOSIS — Z5112 Encounter for antineoplastic immunotherapy: Secondary | ICD-10-CM

## 2019-04-16 DIAGNOSIS — Z79899 Other long term (current) drug therapy: Secondary | ICD-10-CM | POA: Diagnosis not present

## 2019-04-16 DIAGNOSIS — R634 Abnormal weight loss: Secondary | ICD-10-CM | POA: Insufficient documentation

## 2019-04-16 LAB — CBC WITH DIFFERENTIAL/PLATELET
Abs Immature Granulocytes: 0.03 10*3/uL (ref 0.00–0.07)
Basophils Absolute: 0 10*3/uL (ref 0.0–0.1)
Basophils Relative: 1 %
Eosinophils Absolute: 0 10*3/uL (ref 0.0–0.5)
Eosinophils Relative: 1 %
HCT: 39.7 % (ref 39.0–52.0)
Hemoglobin: 14 g/dL (ref 13.0–17.0)
Immature Granulocytes: 1 %
Lymphocytes Relative: 24 %
Lymphs Abs: 1.2 10*3/uL (ref 0.7–4.0)
MCH: 32.6 pg (ref 26.0–34.0)
MCHC: 35.3 g/dL (ref 30.0–36.0)
MCV: 92.3 fL (ref 80.0–100.0)
Monocytes Absolute: 0.5 10*3/uL (ref 0.1–1.0)
Monocytes Relative: 9 %
Neutro Abs: 3.3 10*3/uL (ref 1.7–7.7)
Neutrophils Relative %: 64 %
Platelets: 120 10*3/uL — ABNORMAL LOW (ref 150–400)
RBC: 4.3 MIL/uL (ref 4.22–5.81)
RDW: 14.7 % (ref 11.5–15.5)
WBC: 5.1 10*3/uL (ref 4.0–10.5)
nRBC: 0 % (ref 0.0–0.2)

## 2019-04-16 LAB — CMP (CANCER CENTER ONLY)
ALT: 36 U/L (ref 0–44)
AST: 23 U/L (ref 15–41)
Albumin: 4.2 g/dL (ref 3.5–5.0)
Alkaline Phosphatase: 58 U/L (ref 38–126)
Anion gap: 9 (ref 5–15)
BUN: 17 mg/dL (ref 8–23)
CO2: 26 mmol/L (ref 22–32)
Calcium: 9.4 mg/dL (ref 8.9–10.3)
Chloride: 107 mmol/L (ref 98–111)
Creatinine: 0.97 mg/dL (ref 0.61–1.24)
GFR, Est AFR Am: >60 mL/min (ref >60)
GFR, Estimated: >60 mL/min (ref >60)
Glucose, Bld: 128 mg/dL — ABNORMAL HIGH (ref 70–99)
Potassium: 4.7 mmol/L (ref 3.5–5.1)
Sodium: 142 mmol/L (ref 135–145)
Total Bilirubin: 0.6 mg/dL (ref 0.3–1.2)
Total Protein: 7.3 g/dL (ref 6.5–8.1)

## 2019-04-16 LAB — LACTATE DEHYDROGENASE: LDH: 135 U/L (ref 98–192)

## 2019-04-16 NOTE — Progress Notes (Signed)
HEMATOLOGY/ONCOLOGY CLINIC NOTE  Date of Service:  04/16/19    Patient Care Team: Jared Sacramento, MD as PCP - General (Family Medicine)   CHIEF COMPLAINTS/PURPOSE OF CONSULTATION:  F/u for SMZL   HISTORY OF PRESENTING ILLNESS:  Jared Nixon is a wonderful 68 y.o. male who has been referred to [place] by Jared Nixon, Jared Flavors, MD for evaluation and management of new splenomegaly with lymphocytosis.  Initially, the patient presented into his PCP's office on 06/11/17 complaining of a small lump under his left lower ribcage which had been present for several weeks. This was palpable, per his PCP's notes and Jared Nixon subsequently ordered a CT A/P to evaluate this further. This was performed on 06/13/17 which confirmed the presence of splenomegaly and portal caval lymphadenopathy which was concerning for lymphoproliferative disease. He also had lab work (summarized as below) which also showed some gradual leukocytosis/lymphocytosis, with worsening anemia and thrombocytopenia. He denies any pain over the area of his splenomegaly, difficulty eating or premature satiation.   In a general sense, he had not had an significant chronic health issues which he is being treated for. He does struggle with some tinnitus involving only the left ear which began around the end of July and has somewhat worsened since. One year ago he also had bilateral inguinal hernia surgery and a basal cell carcinoma excision. He also notes in the 80s that he had a gynecomastia removed, but otherwise of recently he has been feeling at his baseline state-of-health. He was initially informed that he suffered from leukocytosis earlier this year by Jared Nixon office.   He did smoke for a brief time several decades ago in his 4s (0.5ppd), but none since. He does drink alcohol, and on average he drinks 7-8 beers a week. He denies any chemical/radiaiton exposures or exposure during his career. He does not partake in illicit drug usage. He  has never previously needed a blood transfusion in the past.   He is currently prescribed Clonazepam and Ritalin to use on a daily basis which are both managed by Jared Sacramento, MD. He takes these for dealing with clutter/hoarding issues ongoing within his personal life.   Of note, the patient reports that his father did have bladder cancer. His paternal Aunts all were diagnosed with cancer, but he is unsure of which types. He does have a h/o CVA on his mothers side, but nothing else of note.   On ROS, he does report a 10lbs weight loss which was observed by Jared Nixon and was attributed to his usage of Ritalin. He also notes that he does suffer from sleeping issues. He does experience fatigue intermittently, however, this is not debilitating or hindering his daily activities. He denies fever, chills, night sweats, abdominal pain, nausea, vomiting, diarrhea, rash, or any other associated symptoms.    INTERVAL HISTORY  Jared Nixon is here for management and evaluation of his SMZL. The patient's last visit with [place] was on 01/10/2019. The pt reports that he is doing well overall.  The pt reports he has had a tendon pulled out of place. Pt has been eating well. Pt is concerned about his glucose levels. Pt is interested in additional chemotherapy centers.    Lab results today (04/16/19) of CBC w/diff and CMP is as follows: all values are WNL except for Platelets at 120K, Glucose at 128.   LDH is WNL.   On review of systems, pt reports night sweats and denies fevers, chills,  unexpected weight loss and any other symptoms. Pt has had no belly or chest pains.     MEDICAL HISTORY:  Past Medical History:  Diagnosis Date  . Sinus complaint   . Skin cancer     SURGICAL HISTORY: Past Surgical History:  Procedure Laterality Date  . basal cell carcinoma surgery    . CATARACT EXTRACTION      SOCIAL HISTORY: Social History   Socioeconomic History  . Marital status: Single    Spouse name: Not  on file  . Number of children: Not on file  . Years of education: Not on file  . Highest education level: Not on file  Occupational History  . Not on file  Social Needs  . Financial resource strain: Not on file  . Food insecurity    Worry: Not on file    Inability: Not on file  . Transportation needs    Medical: Not on file    Non-medical: Not on file  Tobacco Use  . Smoking status: Never Smoker  . Smokeless tobacco: Never Used  Substance and Sexual Activity  . Alcohol use: Yes    Comment: occasional  . Drug use: No  . Sexual activity: Not on file  Lifestyle  . Physical activity    Days per week: Not on file    Minutes per session: Not on file  . Stress: Not on file  Relationships  . Social Herbalist on phone: Not on file    Gets together: Not on file    Attends religious service: Not on file    Active member of club or organization: Not on file    Attends meetings of clubs or organizations: Not on file    Relationship status: Not on file  . Intimate partner violence    Fear of current or ex partner: Not on file    Emotionally abused: Not on file    Physically abused: Not on file    Forced sexual activity: Not on file  Other Topics Concern  . Not on file  Social History Narrative  . Not on file   He is originally from [place], [place].   FAMILY HISTORY: No family history on file.  ALLERGIES:  is allergic to penicillins.  MEDICATIONS:  Current Outpatient Medications  Medication Sig Dispense Refill  . amLODipine (NORVASC) 5 MG tablet Take 1 tablet (5 mg total) by mouth daily. 30 tablet 0  . ClonazePAM (KLONOPIN PO) Take 1 mg by mouth 2 (two) times daily.     . diclofenac (VOLTAREN) 25 MG EC tablet Take by mouth 2 (two) times daily.    . fluticasone (FLONASE) 50 MCG/ACT nasal spray Place 1 spray into both nostrils daily.    . methylphenidate (RITALIN) 10 MG tablet Take 10 mg 3 (three) times daily by mouth.     . ondansetron (ZOFRAN ODT) 4 MG  disintegrating tablet Take 1 tablet (4 mg total) by mouth every 8 (eight) hours as needed for nausea or vomiting. (Patient not taking: Reported on 11/27/2017) 20 tablet 0   No current facility-administered medications for this visit.     REVIEW OF SYSTEMS:   A 10+ POINT REVIEW OF SYSTEMS WAS OBTAINED including neurology, dermatology, psychiatry, cardiac, respiratory, lymph, extremities, GI, GU, Musculoskeletal, constitutional, breasts, reproductive, HEENT.  All pertinent positives are noted in the HPI.  All others are negative.    PHYSICAL EXAMINATION: ECOG PERFORMANCE STATUS: 1 - Symptomatic but completely ambulatory  VS stable stable. Reviewed  in EPIC.   GENERAL:alert, in no acute distress and comfortable SKIN: no acute rashes, no significant lesions EYES: conjunctiva are pink and non-injected, sclera anicteric OROPHARYNX: MMM, no exudates, no oropharyngeal erythema or ulceration NECK: supple, no JVD LYMPH:  no palpable lymphadenopathy in the cervical, axillary or inguinal regions LUNGS: clear to auscultation b/l with normal respiratory effort HEART: regular rate & rhythm ABDOMEN:  normoactive bowel sounds , non tender, not distended. Extremity: no pedal edema PSYCH: alert & oriented x 3 with fluent speech NEURO: no focal motor/sensory deficits    LABORATORY DATA:  I have reviewed the data as listed  . CBC Latest Ref Rng & Units 04/16/2019 01/10/2019 11/11/2018  WBC 4.0 - 10.5 K/uL 5.1 5.0 5.8  Hemoglobin 13.0 - 17.0 g/dL 14.0 14.1 14.2  Hematocrit 39.0 - 52.0 % 39.7 41.0 41.5  Platelets 150 - 400 K/uL 120(L) 127(L) 137(L)   CBC    Component Value Date/Time   WBC 5.1 04/16/2019 1405   RBC 4.30 04/16/2019 1405   HGB 14.0 04/16/2019 1405   HGB 13.9 02/25/2018 1312   HGB 11.0 (L) 08/31/2017 1101   HCT 39.7 04/16/2019 1405   HCT 33.8 (L) 08/31/2017 1101   PLT 120 (L) 04/16/2019 1405   PLT 111 (L) 02/25/2018 1312   PLT 84 (L) 08/31/2017 1101   MCV 92.3 04/16/2019 1405    MCV 91.1 08/31/2017 1101   MCH 32.6 04/16/2019 1405   MCHC 35.3 04/16/2019 1405   RDW 14.7 04/16/2019 1405   RDW 17.3 (H) 08/31/2017 1101   LYMPHSABS 1.2 04/16/2019 1405   LYMPHSABS 11.4 (H) 08/31/2017 1101   MONOABS 0.5 04/16/2019 1405   MONOABS 0.7 08/31/2017 1101   EOSABS 0.0 04/16/2019 1405   EOSABS 0.1 08/31/2017 1101   BASOSABS 0.0 04/16/2019 1405   BASOSABS 0.0 08/31/2017 1101    . CMP Latest Ref Rng & Units 04/16/2019 01/10/2019 11/11/2018  Glucose 70 - 99 mg/dL 128(H) 116(H) 117(H)  BUN 8 - 23 mg/dL 17 18 21   Creatinine 0.61 - 1.24 mg/dL 0.97 0.95 0.91  Sodium 135 - 145 mmol/L 142 141 142  Potassium 3.5 - 5.1 mmol/L 4.7 3.8 3.9  Chloride 98 - 111 mmol/L 107 107 107  CO2 22 - 32 mmol/L 26 25 23   Calcium 8.9 - 10.3 mg/dL 9.4 8.9 9.2  Total Protein 6.5 - 8.1 g/dL 7.3 7.5 7.8  Total Bilirubin 0.3 - 1.2 mg/dL 0.6 0.6 0.7  Alkaline Phos 38 - 126 U/L 58 55 62  AST 15 - 41 U/L 23 24 25   ALT 0 - 44 U/L 36 37 38     -flow cytometry consistent with marginal zone lymphoma vs splenic Lymphoma      RADIOGRAPHIC STUDIES: I have personally reviewed the radiological images as listed and agreed with the findings in the report. No results found.  ASSESSMENT & PLAN:   ERIE RADU is a  68 y.o. male who presents to our clinic to discuss:  #1: Splenic marginal Zone lymphoma  Initially presented with Splenomegaly with worsening leukocytosis/lymphocytosis, anemia, and thrombocytopenia He completed 4/4 cycles of Rituxan 09/11/17-10/02/17.   US abdomen from 11/20/17 revealed Splenomegaly with splenic volume of 719 cubic cm, compared with PET-CT volume of 1100 cubic cm. 2. Cholelithiasis with no sonographic evidence for cholecystitis or biliary dilatation 3. Small cysts in the kidneys 4. Slight increased hepatic echogenicity, possible mild steatosis.  S/p 6 maintenance cycles of Rituxan every 2 months, completed March 2020  #2 Elevated BP #3  Raynaud's  Syndrome   PLAN:   -Discussed pt labwork today, 04/16/19; all values are WNL except for Platelets at 120K, Glucose at 128.  -Discussed LDH that is WNL.  -Discussed testing fasting glucose with pt's PCP.   -Discussed pt's remission status. Patient has no lab or clinical evidence of progression of SMZL at this time.  FOLLOW UP: RTC with Jared Irene Limbo with labs in 4 months  The total time spent in the appt was 15 minutes and more than 50% was on counseling and direct patient cares.  All of the patient's questions were answered with apparent satisfaction. The patient knows to call the clinic with any problems, questions or concerns.     Sullivan Lone MD MS AAHIVMS [facility] Hematology/Oncology Physician [facility]  (Office):       [phone]  (Work cell):  [phone] (Fax):           [phone]  04/16/2019 4:57 PM   I, Jacqualyn Posey, am acting as a Education administrator for Jared. Sullivan Lone.   .I have reviewed the above documentation for accuracy and completeness, and I agree with the above. Brunetta Genera MD

## 2019-04-17 ENCOUNTER — Telehealth: Payer: Self-pay | Admitting: Hematology

## 2019-04-17 NOTE — Telephone Encounter (Signed)
Scheduled appt per 8/19 los.  Left a voice message of appt date and time.

## 2019-05-22 DIAGNOSIS — L309 Dermatitis, unspecified: Secondary | ICD-10-CM | POA: Insufficient documentation

## 2019-07-02 ENCOUNTER — Encounter: Payer: Self-pay | Admitting: Podiatry

## 2019-07-02 ENCOUNTER — Ambulatory Visit (INDEPENDENT_AMBULATORY_CARE_PROVIDER_SITE_OTHER): Payer: Medicare Other | Admitting: Podiatry

## 2019-07-02 ENCOUNTER — Other Ambulatory Visit: Payer: Self-pay

## 2019-07-02 ENCOUNTER — Other Ambulatory Visit: Payer: Self-pay | Admitting: Podiatry

## 2019-07-02 ENCOUNTER — Ambulatory Visit (INDEPENDENT_AMBULATORY_CARE_PROVIDER_SITE_OTHER): Payer: Medicare Other

## 2019-07-02 VITALS — BP 145/82 | HR 88

## 2019-07-02 DIAGNOSIS — M79671 Pain in right foot: Secondary | ICD-10-CM | POA: Diagnosis not present

## 2019-07-02 DIAGNOSIS — M76821 Posterior tibial tendinitis, right leg: Secondary | ICD-10-CM

## 2019-07-03 NOTE — Progress Notes (Signed)
Subjective:   Patient ID: Jared Nixon, male   DOB: 68 y.o.   MRN: [id number]   HPI Patient states has had several injections from Dr. Rayburn Felt continues to have discomfort in the bottom of the right heel and states that he had tried some bracing and he has a boot at home.  Patient states is been going on for about 6 months   Review of Systems  All other systems reviewed and are negative.       Objective:  Physical Exam Vitals signs and nursing note reviewed.  Constitutional:      Appearance: He is well-developed.  Pulmonary:     Effort: Pulmonary effort is normal.  Musculoskeletal: Normal range of motion.  Skin:    General: Skin is warm.  Neurological:     Mental Status: He is alert.     Neurovascular status intact muscle strength found to be adequate range of motion within normal limits with patient noted to have discomfort still in the plantar heel right that shows periods of improvement but patient admits he only wear the boot for several weeks after the last treatment several months ago     Assessment:  Acute plantar fasciitis right present     Plan:  Today I went ahead and I reviewed condition and x-rays.  We will get a try conservative care but we may require more aggressive treatment plan based on what has had done so far.  I did do sterile prep and reinjected the fascia 3 mg Kenalog 5 mg Xylocaine and a advised on his air fracture walker explaining how to use it properly.  Reappoint to recheck  X-rays indicate there is spur formation but no indications of stress fracture or arthritic condition

## 2019-07-23 ENCOUNTER — Encounter: Payer: Self-pay | Admitting: Podiatry

## 2019-07-23 ENCOUNTER — Ambulatory Visit (INDEPENDENT_AMBULATORY_CARE_PROVIDER_SITE_OTHER): Payer: Medicare Other | Admitting: Podiatry

## 2019-07-23 ENCOUNTER — Other Ambulatory Visit: Payer: Self-pay

## 2019-07-23 DIAGNOSIS — M76821 Posterior tibial tendinitis, right leg: Secondary | ICD-10-CM

## 2019-07-23 NOTE — Addendum Note (Signed)
Addended by: Celene Skeen A on: 07/23/2019 04:22 PM   Modules accepted: Orders

## 2019-07-23 NOTE — Progress Notes (Signed)
Subjective:   Patient ID: Jared Nixon, Jared Nixon   DOB: 68 y.o.   MRN: [id number]   HPI Patient states his big toe joint is still really bothering me and it is hard for me to sleep at night as it rubs and gets irritated.  Patient states that he tried different treatment options without relief and that the injection did not help   ROS      Objective:  Physical Exam  Neurovascular status with prominence around the first MPJ right that is irritated and makes pressure or sleeping difficult F2 current     Assessment:  Chronic bunion formation right with pain     Plan:  Reviewed condition and I do think ultimately we may need to do a modified type of right bunionectomy to try to take pressure off this but at this point we will get a try silicone she will see if she responds over the next 3 weeks and if not better will need to consider something else.  He does take low-dose warfarin and he will call the warfarin clinic and may have to stop it several days if we decide to do outpatient surgery

## 2019-08-15 ENCOUNTER — Inpatient Hospital Stay (HOSPITAL_BASED_OUTPATIENT_CLINIC_OR_DEPARTMENT_OTHER): Payer: Medicare Other | Admitting: Hematology

## 2019-08-15 ENCOUNTER — Inpatient Hospital Stay: Payer: Medicare Other | Attending: Hematology

## 2019-08-15 ENCOUNTER — Other Ambulatory Visit: Payer: Self-pay

## 2019-08-15 VITALS — BP 152/79 | HR 83 | Temp 98.3°F | Resp 18 | Ht 71.0 in | Wt 182.3 lb

## 2019-08-15 DIAGNOSIS — I10 Essential (primary) hypertension: Secondary | ICD-10-CM | POA: Insufficient documentation

## 2019-08-15 DIAGNOSIS — C8307 Small cell B-cell lymphoma, spleen: Secondary | ICD-10-CM | POA: Diagnosis present

## 2019-08-15 DIAGNOSIS — Z9221 Personal history of antineoplastic chemotherapy: Secondary | ICD-10-CM | POA: Insufficient documentation

## 2019-08-15 DIAGNOSIS — I7300 Raynaud's syndrome without gangrene: Secondary | ICD-10-CM | POA: Diagnosis not present

## 2019-08-15 DIAGNOSIS — D696 Thrombocytopenia, unspecified: Secondary | ICD-10-CM

## 2019-08-15 DIAGNOSIS — Z8052 Family history of malignant neoplasm of bladder: Secondary | ICD-10-CM | POA: Diagnosis not present

## 2019-08-15 DIAGNOSIS — Z79899 Other long term (current) drug therapy: Secondary | ICD-10-CM | POA: Insufficient documentation

## 2019-08-15 LAB — CBC WITH DIFFERENTIAL/PLATELET
Abs Immature Granulocytes: 0.04 10*3/uL (ref 0.00–0.07)
Basophils Absolute: 0 10*3/uL (ref 0.0–0.1)
Basophils Relative: 1 %
Eosinophils Absolute: 0.1 10*3/uL (ref 0.0–0.5)
Eosinophils Relative: 1 %
HCT: 40.7 % (ref 39.0–52.0)
Hemoglobin: 14.5 g/dL (ref 13.0–17.0)
Immature Granulocytes: 1 %
Lymphocytes Relative: 28 %
Lymphs Abs: 1.4 10*3/uL (ref 0.7–4.0)
MCH: 33 pg (ref 26.0–34.0)
MCHC: 35.6 g/dL (ref 30.0–36.0)
MCV: 92.5 fL (ref 80.0–100.0)
Monocytes Absolute: 0.4 10*3/uL (ref 0.1–1.0)
Monocytes Relative: 9 %
Neutro Abs: 3.1 10*3/uL (ref 1.7–7.7)
Neutrophils Relative %: 60 %
Platelets: 118 10*3/uL — ABNORMAL LOW (ref 150–400)
RBC: 4.4 MIL/uL (ref 4.22–5.81)
RDW: 14.4 % (ref 11.5–15.5)
WBC: 5 10*3/uL (ref 4.0–10.5)
nRBC: 0 % (ref 0.0–0.2)

## 2019-08-15 LAB — CMP (CANCER CENTER ONLY)
ALT: 38 U/L (ref 0–44)
AST: 26 U/L (ref 15–41)
Albumin: 4.3 g/dL (ref 3.5–5.0)
Alkaline Phosphatase: 55 U/L (ref 38–126)
Anion gap: 11 (ref 5–15)
BUN: 20 mg/dL (ref 8–23)
CO2: 25 mmol/L (ref 22–32)
Calcium: 8.9 mg/dL (ref 8.9–10.3)
Chloride: 104 mmol/L (ref 98–111)
Creatinine: 0.99 mg/dL (ref 0.61–1.24)
GFR, Est AFR Am: >60 mL/min (ref >60)
GFR, Estimated: >60 mL/min (ref >60)
Glucose, Bld: 124 mg/dL — ABNORMAL HIGH (ref 70–99)
Potassium: 4.1 mmol/L (ref 3.5–5.1)
Sodium: 140 mmol/L (ref 135–145)
Total Bilirubin: 0.7 mg/dL (ref 0.3–1.2)
Total Protein: 7.4 g/dL (ref 6.5–8.1)

## 2019-08-15 LAB — LACTATE DEHYDROGENASE: LDH: 157 U/L (ref 98–192)

## 2019-08-15 NOTE — Progress Notes (Signed)
HEMATOLOGY/ONCOLOGY CLINIC NOTE  Date of Service:  08/15/19    Patient Care Team: Christain Sacramento, MD as PCP - General (Family Medicine)   CHIEF COMPLAINTS/PURPOSE OF CONSULTATION:  F/u for SMZL   HISTORY OF PRESENTING ILLNESS:  Jared Nixon is a wonderful 68 y.o. male who has been referred to [place] by Dr Redmond Pulling, [name], MD for evaluation and management of new splenomegaly with lymphocytosis.  Initially, the patient presented into his PCP's office on 06/11/17 complaining of a small lump under his left lower ribcage which had been present for several weeks. This was palpable, per his PCP's notes and Dr Redmond Pulling subsequently ordered a CT A/P to evaluate this further. This was performed on 06/13/17 which confirmed the presence of splenomegaly and portal caval lymphadenopathy which was concerning for lymphoproliferative disease. He also had lab work (summarized as below) which also showed some gradual leukocytosis/lymphocytosis, with worsening anemia and thrombocytopenia. He denies any pain over the area of his splenomegaly, difficulty eating or premature satiation.   In a general sense, he had not had an significant chronic health issues which he is being treated for. He does struggle with some tinnitus involving only the left ear which began around the end of July and has somewhat worsened since. One year ago he also had bilateral inguinal hernia surgery and a basal cell carcinoma excision. He also notes in the 80s that he had a gynecomastia removed, but otherwise of recently he has been feeling at his baseline state-of-health. He was initially informed that he suffered from leukocytosis earlier this year by Dr Dois Davenport office.   He did smoke for a brief time several decades ago in his 60s (0.5ppd), but none since. He does drink alcohol, and on average he drinks 7-8 beers a week. He denies any chemical/radiaiton exposures or exposure during his career. He does not partake in illicit drug usage. He  has never previously needed a blood transfusion in the past.   He is currently prescribed Clonazepam and Ritalin to use on a daily basis which are both managed by Christain Sacramento, MD. He takes these for dealing with clutter/hoarding issues ongoing within his personal life.   Of note, the patient reports that his father did have bladder cancer. His paternal Aunts all were diagnosed with cancer, but he is unsure of which types. He does have a h/o CVA on his mothers side, but nothing else of note.   On ROS, he does report a 10lbs weight loss which was observed by Dr Redmond Pulling and was attributed to his usage of Ritalin. He also notes that he does suffer from sleeping issues. He does experience fatigue intermittently, however, this is not debilitating or hindering his daily activities. He denies fever, chills, night sweats, abdominal pain, nausea, vomiting, diarrhea, rash, or any other associated symptoms.    INTERVAL HISTORY   [name] is here for management and evaluation of his SMZL. The patient's last visit with [place] was on 04/16/2019. The pt reports that he is doing well overall.  The pt reports no new concerns.  He is taking a daily multivitamin.  He has been managing social distancing due to the current pandemic.  Lab results today (08/15/19) of CBC w/diff and CMP is as follows: all values are WNL except for Platelets at 118, Glucose at 124.  On review of systems, pt reports no new concerns and denies night sweats, fevers, chills, new lumps and bumps, back pain, abdominal pain, leg swelling,  unexpected weight loss and any other symptoms.   MEDICAL HISTORY:  Past Medical History:  Diagnosis Date  . Sinus complaint   . Skin cancer     SURGICAL HISTORY: Past Surgical History:  Procedure Laterality Date  . basal cell carcinoma surgery    . CATARACT EXTRACTION      SOCIAL HISTORY: Social History   Socioeconomic History  . Marital status: Single    Spouse name: Not on file  .  Number of children: Not on file  . Years of education: Not on file  . Highest education level: Not on file  Occupational History  . Not on file  Tobacco Use  . Smoking status: Never Smoker  . Smokeless tobacco: Never Used  Substance and Sexual Activity  . Alcohol use: Yes    Comment: occasional  . Drug use: No  . Sexual activity: Not on file  Other Topics Concern  . Not on file  Social History Narrative  . Not on file   Social Determinants of Health   Financial Resource Strain:   . Difficulty of Paying Living Expenses: Not on file  Food Insecurity:   . Worried About Charity fundraiser in the Last Year: Not on file  . Ran Out of Food in the Last Year: Not on file  Transportation Needs:   . Lack of Transportation (Medical): Not on file  . Lack of Transportation (Non-Medical): Not on file  Physical Activity:   . Days of Exercise per Week: Not on file  . Minutes of Exercise per Session: Not on file  Stress:   . Feeling of Stress : Not on file  Social Connections:   . Frequency of Communication with Friends and Family: Not on file  . Frequency of Social Gatherings with Friends and Family: Not on file  . Attends Religious Services: Not on file  . Active Member of Clubs or Organizations: Not on file  . Attends Archivist Meetings: Not on file  . Marital Status: Not on file  Intimate Partner Violence:   . Fear of Current or Ex-Partner: Not on file  . Emotionally Abused: Not on file  . Physically Abused: Not on file  . Sexually Abused: Not on file   He is originally from [facility], [place].   FAMILY HISTORY: No family history on file.  ALLERGIES:  is allergic to penicillins; pramipexole; and fenofibrate.  MEDICATIONS:  Current Outpatient Medications  Medication Sig Dispense Refill  . amLODipine (NORVASC) 5 MG tablet Take 1 tablet (5 mg total) by mouth daily. 30 tablet 0  . clonazePAM (KLONOPIN) 1 MG tablet TAKE 1 TABLET TWICE A DAY AS NEEDED FOR ANXIETY OR  INSOMNIA    . diclofenac Sodium (VOLTAREN) 1 % GEL diclofenac 1 % topical gel  APPLY 2 GRAM TO THE AFFECTED AREA(S) BY TOPICAL ROUTE 4 TIMES PER DAY    . fluticasone (FLONASE) 50 MCG/ACT nasal spray Place 1 spray into both nostrils daily.    . methylphenidate (RITALIN) 10 MG tablet Take 10 mg 3 (three) times daily by mouth.      No current facility-administered medications for this visit.    REVIEW OF SYSTEMS:   A 10+ POINT REVIEW OF SYSTEMS WAS OBTAINED including neurology, dermatology, psychiatry, cardiac, respiratory, lymph, extremities, GI, GU, Musculoskeletal, constitutional, breasts, reproductive, HEENT.  All pertinent positives are noted in the HPI.  All others are negative.     PHYSICAL EXAMINATION: ECOG FS:1 - Symptomatic but completely ambulatory  Vitals:  08/15/19 1115  BP: (!) 152/79  Pulse: 83  Resp: 18  Temp: 98.3 F (36.8 C)  SpO2: 100%   Wt Readings from Last 3 Encounters:  08/15/19 182 lb 4.8 oz (82.7 kg)  04/16/19 180 lb 8 oz (81.9 kg)  01/10/19 180 lb 12.8 oz (82 kg)   Body mass index is 25.43 kg/m.    GENERAL:alert, in no acute distress and comfortable SKIN: no acute rashes, no significant lesions EYES: conjunctiva are pink and non-injected, sclera anicteric OROPHARYNX: MMM, no exudates, no oropharyngeal erythema or ulceration NECK: supple, no JVD LYMPH:  no palpable lymphadenopathy in the cervical, axillary or inguinal regions LUNGS: clear to auscultation b/l with normal respiratory effort HEART: regular rate & rhythm ABDOMEN:  normoactive bowel sounds , non tender, not distended. Extremity: no pedal edema PSYCH: alert & oriented x 3 with fluent speech NEURO: no focal motor/sensory deficits   LABORATORY DATA:  I have reviewed the data as listed  . CBC Latest Ref Rng & Units 08/15/2019 04/16/2019 01/10/2019  WBC 4.0 - 10.5 K/uL 5.0 5.1 5.0  Hemoglobin 13.0 - 17.0 g/dL 14.5 14.0 14.1  Hematocrit 39.0 - 52.0 % 40.7 39.7 41.0  Platelets 150 -  400 K/uL 118(L) 120(L) 127(L)   CBC    Component Value Date/Time   WBC 5.0 08/15/2019 1059   RBC 4.40 08/15/2019 1059   HGB 14.5 08/15/2019 1059   HGB 13.9 02/25/2018 1312   HGB 11.0 (L) 08/31/2017 1101   HCT 40.7 08/15/2019 1059   HCT 33.8 (L) 08/31/2017 1101   PLT 118 (L) 08/15/2019 1059   PLT 111 (L) 02/25/2018 1312   PLT 84 (L) 08/31/2017 1101   MCV 92.5 08/15/2019 1059   MCV 91.1 08/31/2017 1101   MCH 33.0 08/15/2019 1059   MCHC 35.6 08/15/2019 1059   RDW 14.4 08/15/2019 1059   RDW 17.3 (H) 08/31/2017 1101   LYMPHSABS 1.4 08/15/2019 1059   LYMPHSABS 11.4 (H) 08/31/2017 1101   MONOABS 0.4 08/15/2019 1059   MONOABS 0.7 08/31/2017 1101   EOSABS 0.1 08/15/2019 1059   EOSABS 0.1 08/31/2017 1101   BASOSABS 0.0 08/15/2019 1059   BASOSABS 0.0 08/31/2017 1101    . CMP Latest Ref Rng & Units 08/15/2019 04/16/2019 01/10/2019  Glucose 70 - 99 mg/dL 124(H) 128(H) 116(H)  BUN 8 - 23 mg/dL 20 17 18   Creatinine 0.61 - 1.24 mg/dL 0.99 0.97 0.95  Sodium 135 - 145 mmol/L 140 142 141  Potassium 3.5 - 5.1 mmol/L 4.1 4.7 3.8  Chloride 98 - 111 mmol/L 104 107 107  CO2 22 - 32 mmol/L 25 26 25   Calcium 8.9 - 10.3 mg/dL 8.9 9.4 8.9  Total Protein 6.5 - 8.1 g/dL 7.4 7.3 7.5  Total Bilirubin 0.3 - 1.2 mg/dL 0.7 0.6 0.6  Alkaline Phos 38 - 126 U/L 55 58 55  AST 15 - 41 U/L 26 23 24   ALT 0 - 44 U/L 38 36 37     -flow cytometry consistent with marginal zone lymphoma vs splenic Lymphoma      RADIOGRAPHIC STUDIES: I have personally reviewed the radiological images as listed and agreed with the findings in the report. No results found.  ASSESSMENT & PLAN:   EPPIE PULE is a  68 y.o. male who presents to our clinic to discuss:  #1: Splenic marginal Zone lymphoma  Initially presented with Splenomegaly with worsening leukocytosis/lymphocytosis, anemia, and thrombocytopenia He completed 4/4 cycles of Rituxan 09/11/17-10/02/17.   US abdomen from 11/20/17 revealed  Splenomegaly with  splenic volume of 719 cubic cm, compared with PET-CT volume of 1100 cubic cm. 2. Cholelithiasis with no sonographic evidence for cholecystitis or biliary dilatation 3. Small cysts in the kidneys 4. Slight increased hepatic echogenicity, possible mild steatosis.  S/p 6 maintenance cycles of Rituxan every 2 months, completed March 2020  #2 Elevated BP #3 Raynaud's  Syndrome   PLAN: -Discussed pt labwork today, 08/15/19; Platelets at 118,  CMP WNL  and LDH- WNL at 157 -Discussed 08/15/19 Platelets at 118. Advised that he is at no increase risk of bleeding. Recommended to limit the use of Motrin and Advil. Taking Tylenol would be a great substitute for Motrin and Advil. -Discussed 08/15/19 Hemoglobin at 14.5 -Discussed 08/15/19 WBC at 5.0 -Advised that there are no current lab or clinical concern for lymphoma progression at this time.  FOLLOW UP: RTC with Dr Irene Limbo with labs in 4 months  The total time spent in the appt was 15 minutes and more than 50% was on counseling and direct patient cares.  All of the patient's questions were answered with apparent satisfaction. The patient knows to call the clinic with any problems, questions or concerns.      Sullivan Lone MD MS AAHIVMS [facility] Hematology/Oncology Physician [facility]  (Office):       [phone]  (Work cell):  [phone] (Fax):           [phone]  08/15/2019 6:32 AM   I, Scot Dock, am acting as a scribe for Dr. Sullivan Lone.   .I have reviewed the above documentation for accuracy and completeness, and I agree with the above. Brunetta Genera MD

## 2019-08-18 ENCOUNTER — Telehealth: Payer: Self-pay | Admitting: Hematology

## 2019-08-18 NOTE — Telephone Encounter (Signed)
Scheduled appt per 12/18 los.  Left a vm of the appt date and time 

## 2019-08-20 ENCOUNTER — Ambulatory Visit: Payer: Medicare Other | Admitting: Podiatry

## 2019-09-03 ENCOUNTER — Other Ambulatory Visit: Payer: Self-pay

## 2019-09-03 ENCOUNTER — Encounter: Payer: Self-pay | Admitting: Podiatry

## 2019-09-03 ENCOUNTER — Ambulatory Visit (INDEPENDENT_AMBULATORY_CARE_PROVIDER_SITE_OTHER): Payer: Medicare Other | Admitting: Podiatry

## 2019-09-03 DIAGNOSIS — M76821 Posterior tibial tendinitis, right leg: Secondary | ICD-10-CM | POA: Diagnosis not present

## 2019-09-05 NOTE — Progress Notes (Signed)
Subjective:   Patient ID: Jared Nixon, male   DOB: 69 y.o.   MRN: [id number]   HPI Patient states the tendon is still bothering him quite a bit and states the bunion site is not really sore now and he wore the boot but he was not able to wear it that much as it did not feel comfortable in it.  Patient states that the pain is not as intense but still present   ROS      Objective:  Physical Exam  Neurovascular status intact with inflammation pain around the posterior tibial insertion into the navicular.  It is painful when I palpated the area with moderate depression of the arch also noted     Assessment:  Continue tendinitis right that has remained frustrating despite her conservative treatment plans     Plan:  Reviewed condition will get a try to continue with conservative treatments anti-inflammatories and patient will be reevaluated again in the next 4 weeks with supportive therapy strongly encouraged with possibility for MRI if symptoms persist

## 2019-10-01 ENCOUNTER — Other Ambulatory Visit: Payer: Self-pay

## 2019-10-01 ENCOUNTER — Encounter: Payer: Self-pay | Admitting: Podiatry

## 2019-10-01 ENCOUNTER — Ambulatory Visit (INDEPENDENT_AMBULATORY_CARE_PROVIDER_SITE_OTHER): Payer: Medicare Other | Admitting: Podiatry

## 2019-10-01 DIAGNOSIS — M76821 Posterior tibial tendinitis, right leg: Secondary | ICD-10-CM | POA: Diagnosis not present

## 2019-10-01 NOTE — Progress Notes (Signed)
Subjective:   Patient ID: Jared Nixon, male   DOB: 69 y.o.   MRN: [id number]   HPI Patient presents stating I am feeling some better but I still have 1 area that remains tender   ROS      Objective:  Physical Exam  Neurovascular status intact with patient's right posterior tib at the insertion improved but 1 area that is tender in a slightly more plantar direction     Assessment:  Posterior tibial tendinitis right that seems to be improving with 1 area of irritation     Plan:  H&P reviewed condition and I did a careful steroid injection 3 mg Dexasone Kenalog advised on reduced activity continue boot usage for 2 more weeks and then start to be on it more.  I want to see her back in 5 weeks and if any issues were to occur prior patient will let us know

## 2019-10-10 ENCOUNTER — Ambulatory Visit: Payer: Medicare Other | Attending: Internal Medicine

## 2019-10-10 DIAGNOSIS — Z23 Encounter for immunization: Secondary | ICD-10-CM

## 2019-10-10 NOTE — Progress Notes (Signed)
   Covid-19 Vaccination Clinic  Name:  ESHAUN MANNE    MRN: [id number] DOB: 1950/10/05  10/10/2019  Mr. Mcguiness was observed post Covid-19 immunization for 15 minutes without incidence. He was provided with Vaccine Information Sheet and instruction to access the V-Safe system.   Mr. Harbeck was instructed to call 911 with any severe reactions post vaccine: [name] Kitchen Difficulty breathing  . Swelling of your face and throat  . A fast heartbeat  . A bad rash all over your body  . Dizziness and weakness    Immunizations Administered    Name Date Dose VIS Date Route   Pfizer COVID-19 Vaccine 10/10/2019  4:38 PM 0.3 mL 08/08/2019 Intramuscular   Manufacturer: [place]   Lot: [id number]   [place]: [id number]

## 2019-11-02 ENCOUNTER — Ambulatory Visit: Payer: Medicare Other | Attending: Internal Medicine

## 2019-11-02 DIAGNOSIS — Z23 Encounter for immunization: Secondary | ICD-10-CM

## 2019-11-02 NOTE — Progress Notes (Signed)
   Covid-19 Vaccination Clinic  Name:  Jared Nixon    MRN: [id number] DOB: 12-Aug-1951  11/02/2019  Jared Nixon was observed post Covid-19 immunization for 15 minutes without incident. He was provided with Vaccine Information Sheet and instruction to access the V-Safe system.   Jared Nixon was instructed to call 911 with any severe reactions post vaccine: [name] Kitchen Difficulty breathing  . Swelling of face and throat  . A fast heartbeat  . A bad rash all over body  . Dizziness and weakness   Immunizations Administered    Name Date Dose VIS Date Route   Pfizer COVID-19 Vaccine 11/02/2019  1:26 PM 0.3 mL 08/08/2019 Intramuscular   Manufacturer: [place]   Lot: [id number]   [place]: [id number]

## 2019-11-05 ENCOUNTER — Ambulatory Visit: Payer: Medicare Other | Admitting: Podiatry

## 2019-11-19 ENCOUNTER — Encounter: Payer: Self-pay | Admitting: Podiatry

## 2019-11-19 ENCOUNTER — Other Ambulatory Visit: Payer: Self-pay

## 2019-11-19 ENCOUNTER — Ambulatory Visit (INDEPENDENT_AMBULATORY_CARE_PROVIDER_SITE_OTHER): Payer: Medicare Other | Admitting: Podiatry

## 2019-11-19 VITALS — Temp 98.5°F

## 2019-11-19 DIAGNOSIS — M76821 Posterior tibial tendinitis, right leg: Secondary | ICD-10-CM

## 2019-11-19 DIAGNOSIS — L84 Corns and callosities: Secondary | ICD-10-CM

## 2019-11-20 NOTE — Progress Notes (Signed)
Subjective:   Patient ID: Jared Nixon, male   DOB: 69 y.o.   MRN: [id number]   HPI Patient states he still having a lot of pain in his right arch and states that the last injection did not give him relief.  Points to the posterior tibial insertion right    ROS      Objective:  Physical Exam  Neurovascular status intact with patient found to have quite a bit of inflammation at the posterior tibial insertion right with moderate depression of the arch but no indications currently of tendon dysfunction     Assessment:  Posterior tibial tendinitis which continues to be resistant so far to treatment despite immobilization and injection treatment     Plan:  H&P discussed condition and possibility for MRI if symptoms do not improve.  We want to try continued conservative care first and I went ahead and I am sending him to physical therapy to try to work on the posterior tibial tendon reduce the stress and continue immobilization.  Reappoint 4 weeks and if symptoms persistent will need to consider MRI for condition

## 2019-11-23 DIAGNOSIS — R5383 Other fatigue: Secondary | ICD-10-CM | POA: Insufficient documentation

## 2019-12-14 NOTE — Progress Notes (Signed)
HEMATOLOGY/ONCOLOGY CLINIC NOTE  Date of Service:  12/15/19    Patient Care Team: Christain Sacramento, MD as PCP - General (Family Medicine)   CHIEF COMPLAINTS/PURPOSE OF CONSULTATION:  F/u for SMZL   HISTORY OF PRESENTING ILLNESS:  Jared Nixon is a wonderful 68 y.o. male who has been referred to [place] by Dr Redmond Pulling, [name], MD for evaluation and management of new splenomegaly with lymphocytosis.  Initially, the patient presented into his PCP's office on 06/11/17 complaining of a small lump under his left lower ribcage which had been present for several weeks. This was palpable, per his PCP's notes and Dr Redmond Pulling subsequently ordered a CT A/P to evaluate this further. This was performed on 06/13/17 which confirmed the presence of splenomegaly and portal caval lymphadenopathy which was concerning for lymphoproliferative disease. He also had lab work (summarized as below) which also showed some gradual leukocytosis/lymphocytosis, with worsening anemia and thrombocytopenia. He denies any pain over the area of his splenomegaly, difficulty eating or premature satiation.   In a general sense, he had not had an significant chronic health issues which he is being treated for. He does struggle with some tinnitus involving only the left ear which began around the end of July and has somewhat worsened since. One year ago he also had bilateral inguinal hernia surgery and a basal cell carcinoma excision. He also notes in the 80s that he had a gynecomastia removed, but otherwise of recently he has been feeling at his baseline state-of-health. He was initially informed that he suffered from leukocytosis earlier this year by Dr Dois Davenport office.   He did smoke for a brief time several decades ago in his 29s (0.5ppd), but none since. He does drink alcohol, and on average he drinks 7-8 beers a week. He denies any chemical/radiaiton exposures or exposure during his career. He does not partake in illicit drug usage. He  has never previously needed a blood transfusion in the past.   He is currently prescribed Clonazepam and Ritalin to use on a daily basis which are both managed by Christain Sacramento, MD. He takes these for dealing with clutter/hoarding issues ongoing within his personal life.   Of note, the patient reports that his father did have bladder cancer. His paternal Aunts all were diagnosed with cancer, but he is unsure of which types. He does have a h/o CVA on his mothers side, but nothing else of note.   On ROS, he does report a 10lbs weight loss which was observed by Dr Redmond Pulling and was attributed to his usage of Ritalin. He also notes that he does suffer from sleeping issues. He does experience fatigue intermittently, however, this is not debilitating or hindering his daily activities. He denies fever, chills, night sweats, abdominal pain, nausea, vomiting, diarrhea, rash, or any other associated symptoms.    INTERVAL HISTORY   Jared Nixon is here for management and evaluation of his SMZL. The patient's last visit with [place] was on 08/15/2019. The pt reports that he is doing well overall.  The pt reports that things have been steady and denies any new concerns. He notes that his hemorrhoids bleed after a bowel movement on occasion. He has had no other abnormal bleeding or bruising.   Lab results today (12/15/19) of CBC w/diff and CMP is as follows: all values are WNL except for PLT at 140K, Glucose at 123. 12/15/2019 LDH at 143  On review of systems, pt reports hemorrhoidal bleeding and denies fevers,  chills, night sweats, unexpected weight loss, infection symptoms, black stools, skin rashes, constipation, diarrhea and any other symptoms.   MEDICAL HISTORY:  Past Medical History:  Diagnosis Date  . Sinus complaint   . Skin cancer     SURGICAL HISTORY: Past Surgical History:  Procedure Laterality Date  . basal cell carcinoma surgery    . CATARACT EXTRACTION      SOCIAL HISTORY: Social History    Socioeconomic History  . Marital status: Single    Spouse name: Not on file  . Number of children: Not on file  . Years of education: Not on file  . Highest education level: Not on file  Occupational History  . Not on file  Tobacco Use  . Smoking status: Never Smoker  . Smokeless tobacco: Never Used  Substance and Sexual Activity  . Alcohol use: Yes    Comment: occasional  . Drug use: No  . Sexual activity: Not on file  Other Topics Concern  . Not on file  Social History Narrative  . Not on file   Social Determinants of Health   Financial Resource Strain:   . Difficulty of Paying Living Expenses:   Food Insecurity:   . Worried About Charity fundraiser in the Last Year:   . Arboriculturist in the Last Year:   Transportation Needs:   . Film/video editor (Medical):   [name] Kitchen Lack of Transportation (Non-Medical):   Physical Activity:   . Days of Exercise per Week:   . Minutes of Exercise per Session:   Stress:   . Feeling of Stress :   Social Connections:   . Frequency of Communication with Friends and Family:   . Frequency of Social Gatherings with Friends and Family:   . Attends Religious Services:   . Active Member of Clubs or Organizations:   . Attends Archivist Meetings:   [name] Kitchen Marital Status:   Intimate Partner Violence:   . Fear of Current or Ex-Partner:   . Emotionally Abused:   [name] Kitchen Physically Abused:   . Sexually Abused:    He is originally from [place], [place].   FAMILY HISTORY: No family history on file.  ALLERGIES:  is allergic to methylphenidate; penicillins; pramipexole; and fenofibrate.  MEDICATIONS:  Current Outpatient Medications  Medication Sig Dispense Refill  . amLODipine (NORVASC) 5 MG tablet Take 1 tablet (5 mg total) by mouth daily. 30 tablet 0  . buPROPion (WELLBUTRIN SR) 100 MG 12 hr tablet     . clonazePAM (KLONOPIN) 1 MG tablet TAKE 1 TABLET TWICE A DAY AS NEEDED FOR ANXIETY OR INSOMNIA    . diclofenac Sodium (VOLTAREN)  1 % GEL diclofenac 1 % topical gel  APPLY 2 GRAM TO THE AFFECTED AREA(S) BY TOPICAL ROUTE 4 TIMES PER DAY    . fluticasone (FLONASE) 50 MCG/ACT nasal spray Place 1 spray into both nostrils daily.     No current facility-administered medications for this visit.    REVIEW OF SYSTEMS:   A 10+ POINT REVIEW OF SYSTEMS WAS OBTAINED including neurology, dermatology, psychiatry, cardiac, respiratory, lymph, extremities, GI, GU, Musculoskeletal, constitutional, breasts, reproductive, HEENT.  All pertinent positives are noted in the HPI.  All others are negative.   PHYSICAL EXAMINATION: ECOG FS:1 - Symptomatic but completely ambulatory  Vitals:   12/15/19 1246  BP: (!) 145/82  Pulse: 76  Resp: 18  Temp: 97.8 F (36.6 C)  SpO2: (!) 10%   Wt Readings from Last 3 Encounters:  12/15/19 186 lb 14.4 oz (84.8 kg)  08/15/19 182 lb 4.8 oz (82.7 kg)  04/16/19 180 lb 8 oz (81.9 kg)   Body mass index is 26.07 kg/m.    GENERAL:alert, in no acute distress and comfortable SKIN: no acute rashes, no significant lesions EYES: conjunctiva are pink and non-injected, sclera anicteric OROPHARYNX: MMM, no exudates, no oropharyngeal erythema or ulceration NECK: supple, no JVD LYMPH:  no palpable lymphadenopathy in the cervical, axillary or inguinal regions LUNGS: clear to auscultation b/l with normal respiratory effort HEART: regular rate & rhythm ABDOMEN:  normoactive bowel sounds , non tender, not distended. No palpable hepatosplenomegaly.  Extremity: no pedal edema PSYCH: alert & oriented x 3 with fluent speech NEURO: no focal motor/sensory deficits  LABORATORY DATA:  I have reviewed the data as listed  . CBC Latest Ref Rng & Units 12/15/2019 08/15/2019 04/16/2019  WBC 4.0 - 10.5 K/uL 5.8 5.0 5.1  Hemoglobin 13.0 - 17.0 g/dL 14.9 14.5 14.0  Hematocrit 39.0 - 52.0 % 42.0 40.7 39.7  Platelets 150 - 400 K/uL 140(L) 118(L) 120(L)   CBC    Component Value Date/Time   WBC 5.8 12/15/2019 1114   RBC  4.58 12/15/2019 1114   HGB 14.9 12/15/2019 1114   HGB 13.9 02/25/2018 1312   HGB 11.0 (L) 08/31/2017 1101   HCT 42.0 12/15/2019 1114   HCT 33.8 (L) 08/31/2017 1101   PLT 140 (L) 12/15/2019 1114   PLT 111 (L) 02/25/2018 1312   PLT 84 (L) 08/31/2017 1101   MCV 91.7 12/15/2019 1114   MCV 91.1 08/31/2017 1101   MCH 32.5 12/15/2019 1114   MCHC 35.5 12/15/2019 1114   RDW 14.6 12/15/2019 1114   RDW 17.3 (H) 08/31/2017 1101   LYMPHSABS 1.6 12/15/2019 1114   LYMPHSABS 11.4 (H) 08/31/2017 1101   MONOABS 0.6 12/15/2019 1114   MONOABS 0.7 08/31/2017 1101   EOSABS 0.1 12/15/2019 1114   EOSABS 0.1 08/31/2017 1101   BASOSABS 0.0 12/15/2019 1114   BASOSABS 0.0 08/31/2017 1101    . CMP Latest Ref Rng & Units 12/15/2019 08/15/2019 04/16/2019  Glucose 70 - 99 mg/dL 123(H) 124(H) 128(H)  BUN 8 - 23 mg/dL 15 20 17   Creatinine 0.61 - 1.24 mg/dL 0.97 0.99 0.97  Sodium 135 - 145 mmol/L 138 140 142  Potassium 3.5 - 5.1 mmol/L 3.9 4.1 4.7  Chloride 98 - 111 mmol/L 106 104 107  CO2 22 - 32 mmol/L 22 25 26   Calcium 8.9 - 10.3 mg/dL 8.9 8.9 9.4  Total Protein 6.5 - 8.1 g/dL 7.7 7.4 7.3  Total Bilirubin 0.3 - 1.2 mg/dL 0.7 0.7 0.6  Alkaline Phos 38 - 126 U/L 65 55 58  AST 15 - 41 U/L 28 26 23   ALT 0 - 44 U/L 41 38 36     -flow cytometry consistent with marginal zone lymphoma vs splenic Lymphoma      RADIOGRAPHIC STUDIES: I have personally reviewed the radiological images as listed and agreed with the findings in the report. No results found.  ASSESSMENT & PLAN:   Jared Nixon is a  69 y.o. male who presents to our clinic to discuss:  #1: Splenic marginal Zone lymphoma  Initially presented with Splenomegaly with worsening leukocytosis/lymphocytosis, anemia, and thrombocytopenia He completed 4/4 cycles of Rituxan 09/11/17-10/02/17.   US abdomen from 11/20/17 revealed Splenomegaly with splenic volume of 719 cubic cm, compared with PET-CT volume of 1100 cubic cm. 2. Cholelithiasis with no  sonographic evidence for cholecystitis or biliary  dilatation 3. Small cysts in the kidneys 4. Slight increased hepatic echogenicity, possible mild steatosis.  S/p 6 maintenance cycles of Rituxan every 2 months, completed March 2020  #2 Elevated BP #3 Raynaud's  Syndrome   PLAN: -Discussed pt labwork today, 12/15/19; blood counts and chemistries steady, PLT improved, LDH is WNL -No lab or clinical evidence of SMZL recurrence/progression at this time -Due to the stability of disease, will move follow-ups to every 6 months  -Recommend pt f/u with PCP for hemorrhoid management -Will see back in 6 months with labs    FOLLOW UP: RTC with Dr Irene Limbo with labs in 6 months   The total time spent in the appt was 20 minutes and more than 50% was on counseling and direct patient cares.  All of the patient's questions were answered with apparent satisfaction. The patient knows to call the clinic with any problems, questions or concerns.   Sullivan Lone MD [place] AAHIVMS [facility] Hematology/Oncology Physician [facility]  (Office):       [phone]  (Work cell):  [phone] (Fax):           [phone]  12/15/2019 1:11 PM   I, Yevette Edwards, am acting as a scribe for Dr. Sullivan Lone.   .I have reviewed the above documentation for accuracy and completeness, and I agree with the above. Brunetta Genera MD

## 2019-12-15 ENCOUNTER — Inpatient Hospital Stay (HOSPITAL_BASED_OUTPATIENT_CLINIC_OR_DEPARTMENT_OTHER): Payer: Medicare Other | Admitting: Hematology

## 2019-12-15 ENCOUNTER — Other Ambulatory Visit: Payer: Self-pay

## 2019-12-15 ENCOUNTER — Telehealth: Payer: Self-pay | Admitting: Hematology

## 2019-12-15 ENCOUNTER — Inpatient Hospital Stay: Payer: Medicare Other | Attending: Hematology

## 2019-12-15 VITALS — BP 145/82 | HR 76 | Temp 97.8°F | Resp 18 | Ht 71.0 in | Wt 186.9 lb

## 2019-12-15 DIAGNOSIS — Z85828 Personal history of other malignant neoplasm of skin: Secondary | ICD-10-CM | POA: Insufficient documentation

## 2019-12-15 DIAGNOSIS — C8307 Small cell B-cell lymphoma, spleen: Secondary | ICD-10-CM | POA: Insufficient documentation

## 2019-12-15 DIAGNOSIS — D696 Thrombocytopenia, unspecified: Secondary | ICD-10-CM

## 2019-12-15 LAB — CBC WITH DIFFERENTIAL/PLATELET
Abs Immature Granulocytes: 0.04 10*3/uL (ref 0.00–0.07)
Basophils Absolute: 0 10*3/uL (ref 0.0–0.1)
Basophils Relative: 1 %
Eosinophils Absolute: 0.1 10*3/uL (ref 0.0–0.5)
Eosinophils Relative: 2 %
HCT: 42 % (ref 39.0–52.0)
Hemoglobin: 14.9 g/dL (ref 13.0–17.0)
Immature Granulocytes: 1 %
Lymphocytes Relative: 27 %
Lymphs Abs: 1.6 10*3/uL (ref 0.7–4.0)
MCH: 32.5 pg (ref 26.0–34.0)
MCHC: 35.5 g/dL (ref 30.0–36.0)
MCV: 91.7 fL (ref 80.0–100.0)
Monocytes Absolute: 0.6 10*3/uL (ref 0.1–1.0)
Monocytes Relative: 10 %
Neutro Abs: 3.5 10*3/uL (ref 1.7–7.7)
Neutrophils Relative %: 59 %
Platelets: 140 10*3/uL — ABNORMAL LOW (ref 150–400)
RBC: 4.58 MIL/uL (ref 4.22–5.81)
RDW: 14.6 % (ref 11.5–15.5)
WBC: 5.8 10*3/uL (ref 4.0–10.5)
nRBC: 0 % (ref 0.0–0.2)

## 2019-12-15 LAB — CMP (CANCER CENTER ONLY)
ALT: 41 U/L (ref 0–44)
AST: 28 U/L (ref 15–41)
Albumin: 4.1 g/dL (ref 3.5–5.0)
Alkaline Phosphatase: 65 U/L (ref 38–126)
Anion gap: 10 (ref 5–15)
BUN: 15 mg/dL (ref 8–23)
CO2: 22 mmol/L (ref 22–32)
Calcium: 8.9 mg/dL (ref 8.9–10.3)
Chloride: 106 mmol/L (ref 98–111)
Creatinine: 0.97 mg/dL (ref 0.61–1.24)
GFR, Est AFR Am: >60 mL/min (ref >60)
GFR, Estimated: >60 mL/min (ref >60)
Glucose, Bld: 123 mg/dL — ABNORMAL HIGH (ref 70–99)
Potassium: 3.9 mmol/L (ref 3.5–5.1)
Sodium: 138 mmol/L (ref 135–145)
Total Bilirubin: 0.7 mg/dL (ref 0.3–1.2)
Total Protein: 7.7 g/dL (ref 6.5–8.1)

## 2019-12-15 LAB — LACTATE DEHYDROGENASE: LDH: 143 U/L (ref 98–192)

## 2019-12-15 NOTE — Telephone Encounter (Signed)
Scheduled per 04/19 los, patient has received calender  

## 2019-12-17 ENCOUNTER — Other Ambulatory Visit: Payer: Self-pay

## 2019-12-17 ENCOUNTER — Encounter: Payer: Self-pay | Admitting: Podiatry

## 2019-12-17 ENCOUNTER — Ambulatory Visit (INDEPENDENT_AMBULATORY_CARE_PROVIDER_SITE_OTHER): Payer: Medicare Other | Admitting: Podiatry

## 2019-12-17 VITALS — Temp 98.1°F

## 2019-12-17 DIAGNOSIS — M76821 Posterior tibial tendinitis, right leg: Secondary | ICD-10-CM | POA: Diagnosis not present

## 2019-12-17 DIAGNOSIS — T148XXA Other injury of unspecified body region, initial encounter: Secondary | ICD-10-CM

## 2019-12-17 NOTE — Progress Notes (Signed)
Subjective:   Patient ID: Jared Nixon, male   DOB: 70 y.o.   MRN: [id number]   HPI Patient presents stating the right ankle continues to bother her arm wants to see if was a torn clavicle   ROS      Objective:  Physical Exam  Neurovascular status intact with inflammation pain posterior tibial tendon near its insertion into the navicular with chronic pain and failure to respond to immobilization injection anti-inflammatories     Assessment:  Chronic posterior tibial tendon inflammation right with possibility for torn interstitial tendon     Plan:  H&P reviewed condition and we are sending him for MRI with education rendered concerning this today

## 2019-12-18 ENCOUNTER — Telehealth: Payer: Self-pay | Admitting: *Deleted

## 2019-12-18 DIAGNOSIS — T148XXA Other injury of unspecified body region, initial encounter: Secondary | ICD-10-CM

## 2019-12-18 DIAGNOSIS — M79671 Pain in right foot: Secondary | ICD-10-CM

## 2019-12-18 DIAGNOSIS — M76821 Posterior tibial tendinitis, right leg: Secondary | ICD-10-CM

## 2019-12-18 NOTE — Telephone Encounter (Signed)
Orders to K. Mavrakis, CMA for pre-cert, faxed to [place].

## 2019-12-25 NOTE — Telephone Encounter (Signed)
Valery:  Patient has Medicare as their primary insurance. They have [employer] as the secondary insurance.  I called [employer] this morning and they do not require any pre-certifications.  Thank you,  Rolly Pancake, CMA (AAMA)

## 2019-12-26 ENCOUNTER — Other Ambulatory Visit: Payer: Self-pay

## 2019-12-26 ENCOUNTER — Ambulatory Visit
Admission: RE | Admit: 2019-12-26 | Discharge: 2019-12-26 | Disposition: A | Discharge status: Home / Self Care | Payer: Medicare Other | Source: Ambulatory Visit | Attending: Podiatry | Admitting: Podiatry

## 2019-12-26 DIAGNOSIS — M76821 Posterior tibial tendinitis, right leg: Secondary | ICD-10-CM

## 2019-12-26 DIAGNOSIS — M79671 Pain in right foot: Secondary | ICD-10-CM

## 2019-12-26 DIAGNOSIS — T148XXA Other injury of unspecified body region, initial encounter: Secondary | ICD-10-CM

## 2020-02-02 ENCOUNTER — Encounter: Payer: Self-pay | Admitting: Podiatry

## 2020-02-02 ENCOUNTER — Other Ambulatory Visit: Payer: Self-pay

## 2020-02-02 ENCOUNTER — Ambulatory Visit (INDEPENDENT_AMBULATORY_CARE_PROVIDER_SITE_OTHER): Payer: Medicare Other | Admitting: Podiatry

## 2020-02-02 VITALS — Temp 98.0°F

## 2020-02-02 DIAGNOSIS — M76822 Posterior tibial tendinitis, left leg: Secondary | ICD-10-CM | POA: Diagnosis not present

## 2020-02-02 DIAGNOSIS — M76821 Posterior tibial tendinitis, right leg: Secondary | ICD-10-CM | POA: Diagnosis not present

## 2020-02-02 DIAGNOSIS — M79671 Pain in right foot: Secondary | ICD-10-CM

## 2020-02-02 NOTE — Progress Notes (Signed)
Subjective:   Patient ID: Jared Nixon, male   DOB: 69 y.o.   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  We casted for functional orthotics today to try to lift the arch up and we will add a lot of lift to the right side.  Ultimately may require surgery but I did review the MRI with him and is not torn some hopeful we can avoid this in the future

## 2020-02-23 ENCOUNTER — Other Ambulatory Visit: Payer: Self-pay

## 2020-02-23 ENCOUNTER — Ambulatory Visit: Payer: Medicare Other | Admitting: Orthotics

## 2020-02-23 DIAGNOSIS — M79671 Pain in right foot: Secondary | ICD-10-CM

## 2020-02-23 DIAGNOSIS — M76821 Posterior tibial tendinitis, right leg: Secondary | ICD-10-CM

## 2020-02-23 DIAGNOSIS — T148XXA Other injury of unspecified body region, initial encounter: Secondary | ICD-10-CM

## 2020-02-23 NOTE — Progress Notes (Signed)
Patient came in today to pick up custom made foot orthotics.  The goals were accomplished and the patient reported no dissatisfaction with said orthotics.  Patient was advised of breakin period and how to report any issues. 

## 2020-04-27 ENCOUNTER — Ambulatory Visit: Payer: Medicare Other | Attending: Critical Care Medicine

## 2020-04-27 DIAGNOSIS — Z23 Encounter for immunization: Secondary | ICD-10-CM

## 2020-04-27 NOTE — Progress Notes (Signed)
   Covid-19 Vaccination Clinic  Name:  Jared Nixon    MRN: [id number] DOB: April 08, 1951  04/27/2020  Jared Nixon was observed post Covid-19 immunization for 15 minutes without incident. He was provided with Vaccine Information Sheet and instruction to access the V-Safe system.   Jared Nixon was instructed to call 911 with any severe reactions post vaccine: [name] Kitchen Difficulty breathing  . Swelling of face and throat  . A fast heartbeat  . A bad rash all over body  . Dizziness and weakness

## 2020-06-15 ENCOUNTER — Other Ambulatory Visit: Payer: Self-pay

## 2020-06-15 ENCOUNTER — Inpatient Hospital Stay (HOSPITAL_BASED_OUTPATIENT_CLINIC_OR_DEPARTMENT_OTHER): Payer: Medicare Other | Admitting: Hematology

## 2020-06-15 ENCOUNTER — Inpatient Hospital Stay: Payer: Medicare Other | Attending: Hematology

## 2020-06-15 ENCOUNTER — Ambulatory Visit (HOSPITAL_COMMUNITY)
Admission: RE | Admit: 2020-06-15 | Discharge: 2020-06-15 | Disposition: A | Discharge status: Home / Self Care | Payer: Medicare Other | Source: Ambulatory Visit | Attending: Hematology | Admitting: Hematology

## 2020-06-15 VITALS — BP 167/80 | HR 90 | Temp 98.2°F | Resp 18 | Ht 71.0 in | Wt 191.0 lb

## 2020-06-15 DIAGNOSIS — Z8719 Personal history of other diseases of the digestive system: Secondary | ICD-10-CM | POA: Insufficient documentation

## 2020-06-15 DIAGNOSIS — M25562 Pain in left knee: Secondary | ICD-10-CM | POA: Insufficient documentation

## 2020-06-15 DIAGNOSIS — C8307 Small cell B-cell lymphoma, spleen: Secondary | ICD-10-CM

## 2020-06-15 DIAGNOSIS — I10 Essential (primary) hypertension: Secondary | ICD-10-CM | POA: Diagnosis not present

## 2020-06-15 DIAGNOSIS — R634 Abnormal weight loss: Secondary | ICD-10-CM | POA: Diagnosis not present

## 2020-06-15 DIAGNOSIS — D696 Thrombocytopenia, unspecified: Secondary | ICD-10-CM | POA: Diagnosis not present

## 2020-06-15 DIAGNOSIS — Z85828 Personal history of other malignant neoplasm of skin: Secondary | ICD-10-CM | POA: Diagnosis not present

## 2020-06-15 DIAGNOSIS — I7300 Raynaud's syndrome without gangrene: Secondary | ICD-10-CM | POA: Insufficient documentation

## 2020-06-15 DIAGNOSIS — C8580 Other specified types of non-Hodgkin lymphoma, unspecified site: Secondary | ICD-10-CM | POA: Diagnosis present

## 2020-06-15 DIAGNOSIS — Z8052 Family history of malignant neoplasm of bladder: Secondary | ICD-10-CM | POA: Diagnosis not present

## 2020-06-15 LAB — CBC WITH DIFFERENTIAL/PLATELET
Abs Immature Granulocytes: 0.03 10*3/uL (ref 0.00–0.07)
Basophils Absolute: 0 10*3/uL (ref 0.0–0.1)
Basophils Relative: 1 %
Eosinophils Absolute: 0.1 10*3/uL (ref 0.0–0.5)
Eosinophils Relative: 1 %
HCT: 40.8 % (ref 39.0–52.0)
Hemoglobin: 14.3 g/dL (ref 13.0–17.0)
Immature Granulocytes: 1 %
Lymphocytes Relative: 24 %
Lymphs Abs: 1.4 10*3/uL (ref 0.7–4.0)
MCH: 32.3 pg (ref 26.0–34.0)
MCHC: 35 g/dL (ref 30.0–36.0)
MCV: 92.1 fL (ref 80.0–100.0)
Monocytes Absolute: 0.5 10*3/uL (ref 0.1–1.0)
Monocytes Relative: 8 %
Neutro Abs: 3.9 10*3/uL (ref 1.7–7.7)
Neutrophils Relative %: 65 %
Platelets: 138 10*3/uL — ABNORMAL LOW (ref 150–400)
RBC: 4.43 MIL/uL (ref 4.22–5.81)
RDW: 15.2 % (ref 11.5–15.5)
WBC: 5.9 10*3/uL (ref 4.0–10.5)
nRBC: 0 % (ref 0.0–0.2)

## 2020-06-15 LAB — CMP (CANCER CENTER ONLY)
ALT: 34 U/L (ref 0–44)
AST: 26 U/L (ref 15–41)
Albumin: 4 g/dL (ref 3.5–5.0)
Alkaline Phosphatase: 59 U/L (ref 38–126)
Anion gap: 7 (ref 5–15)
BUN: 19 mg/dL (ref 8–23)
CO2: 27 mmol/L (ref 22–32)
Calcium: 9.3 mg/dL (ref 8.9–10.3)
Chloride: 105 mmol/L (ref 98–111)
Creatinine: 1.03 mg/dL (ref 0.61–1.24)
GFR, Estimated: >60 mL/min (ref >60)
Glucose, Bld: 132 mg/dL — ABNORMAL HIGH (ref 70–99)
Potassium: 4 mmol/L (ref 3.5–5.1)
Sodium: 139 mmol/L (ref 135–145)
Total Bilirubin: 0.6 mg/dL (ref 0.3–1.2)
Total Protein: 7.5 g/dL (ref 6.5–8.1)

## 2020-06-15 LAB — LACTATE DEHYDROGENASE: LDH: 149 U/L (ref 98–192)

## 2020-06-15 NOTE — Progress Notes (Signed)
HEMATOLOGY/ONCOLOGY CLINIC NOTE  Date of Service:  06/15/20    Patient Care Team: Christain Sacramento, MD as PCP - General (Family Medicine)   CHIEF COMPLAINTS/PURPOSE OF CONSULTATION:  F/u for SMZL   HISTORY OF PRESENTING ILLNESS:  Jared Nixon is a wonderful 69 y.o. male who has been referred to [place] by Dr Redmond Pulling, [name], MD for evaluation and management of new splenomegaly with lymphocytosis.  Initially, the patient presented into his PCP's office on 06/11/17 complaining of a small lump under his left lower ribcage which had been present for several weeks. This was palpable, per his PCP's notes and Dr Redmond Pulling subsequently ordered a CT A/P to evaluate this further. This was performed on 06/13/17 which confirmed the presence of splenomegaly and portal caval lymphadenopathy which was concerning for lymphoproliferative disease. He also had lab work (summarized as below) which also showed some gradual leukocytosis/lymphocytosis, with worsening anemia and thrombocytopenia. He denies any pain over the area of his splenomegaly, difficulty eating or premature satiation.   In a general sense, he had not had an significant chronic health issues which he is being treated for. He does struggle with some tinnitus involving only the left ear which began around the end of July and has somewhat worsened since. One year ago he also had bilateral inguinal hernia surgery and a basal cell carcinoma excision. He also notes in the 80s that he had a gynecomastia removed, but otherwise of recently he has been feeling at his baseline state-of-health. He was initially informed that he suffered from leukocytosis earlier this year by Dr Dois Davenport office.   He did smoke for a brief time several decades ago in his 85s (0.5ppd), but none since. He does drink alcohol, and on average he drinks 7-8 beers a week. He denies any chemical/radiaiton exposures or exposure during his career. He does not partake in illicit drug usage. He  has never previously needed a blood transfusion in the past.   He is currently prescribed Clonazepam and Ritalin to use on a daily basis which are both managed by Christain Sacramento, MD. He takes these for dealing with clutter/hoarding issues ongoing within his personal life.   Of note, the patient reports that his father did have bladder cancer. His paternal Aunts all were diagnosed with cancer, but he is unsure of which types. He does have a h/o CVA on his mothers side, but nothing else of note.   On ROS, he does report a 10lbs weight loss which was observed by Dr Redmond Pulling and was attributed to his usage of Ritalin. He also notes that he does suffer from sleeping issues. He does experience fatigue intermittently, however, this is not debilitating or hindering his daily activities. He denies fever, chills, night sweats, abdominal pain, nausea, vomiting, diarrhea, rash, or any other associated symptoms.    INTERVAL HISTORY: Jared Nixon is here for management and evaluation of his SMZL. The patient's last visit with [place] was on 12/15/2019. The pt reports that he is doing well overall.  The pt reports he is having some fatigue, but nothing new or different. Pt has had his COVID19 vaccines and booster, as well as his flu vaccine. Pt believes that he is up to date with his Pneumonia vaccines. He had a fall and injured his left knee. It is still bothersome to him.   Lab results today (06/15/20) of CBC w/diff and CMP is as follows: all values are WNL except for PLT at 138K, Glucose  at 132. 06/15/2020 LDH at 149  On review of systems, pt reports left knee pain and denies fevers, chills, night sweats, unexpected weight loss, new lumps/bumps, abdominal pain, new/worsening fatigue, abnormal/excessive bleeding, leg swelling and any other symptoms.   MEDICAL HISTORY:  Past Medical History:  Diagnosis Date  . Sinus complaint   . Skin cancer     SURGICAL HISTORY: Past Surgical History:  Procedure Laterality  Date  . basal cell carcinoma surgery    . CATARACT EXTRACTION      SOCIAL HISTORY: Social History   Socioeconomic History  . Marital status: Single    Spouse name: Not on file  . Number of children: Not on file  . Years of education: Not on file  . Highest education level: Not on file  Occupational History  . Not on file  Tobacco Use  . Smoking status: Never Smoker  . Smokeless tobacco: Never Used  Vaping Use  . Vaping Use: Never used  Substance and Sexual Activity  . Alcohol use: Yes    Comment: occasional  . Drug use: No  . Sexual activity: Not on file  Other Topics Concern  . Not on file  Social History Narrative  . Not on file   Social Determinants of Health   Financial Resource Strain:   . Difficulty of Paying Living Expenses: Not on file  Food Insecurity:   . Worried About Charity fundraiser in the Last Year: Not on file  . Ran Out of Food in the Last Year: Not on file  Transportation Needs:   . Lack of Transportation (Medical): Not on file  . Lack of Transportation (Non-Medical): Not on file  Physical Activity:   . Days of Exercise per Week: Not on file  . Minutes of Exercise per Session: Not on file  Stress:   . Feeling of Stress : Not on file  Social Connections:   . Frequency of Communication with Friends and Family: Not on file  . Frequency of Social Gatherings with Friends and Family: Not on file  . Attends Religious Services: Not on file  . Active Member of Clubs or Organizations: Not on file  . Attends Archivist Meetings: Not on file  . Marital Status: Not on file  Intimate Partner Violence:   . Fear of Current or Ex-Partner: Not on file  . Emotionally Abused: Not on file  . Physically Abused: Not on file  . Sexually Abused: Not on file   He is originally from [facility] [place], [place].   FAMILY HISTORY: No family history on file.  ALLERGIES:  is allergic to methylphenidate, penicillins, pramipexole, and fenofibrate.  MEDICATIONS:   Current Outpatient Medications  Medication Sig Dispense Refill  . amLODipine (NORVASC) 5 MG tablet Take 1 tablet (5 mg total) by mouth daily. 30 tablet 0  . clonazePAM (KLONOPIN) 1 MG tablet TAKE 1 TABLET TWICE A DAY AS NEEDED FOR ANXIETY OR INSOMNIA    . diclofenac Sodium (VOLTAREN) 1 % GEL diclofenac 1 % topical gel  APPLY 2 GRAM TO THE AFFECTED AREA(S) BY TOPICAL ROUTE 4 TIMES PER DAY    . fluticasone (FLONASE) 50 MCG/ACT nasal spray Place 1 spray into both nostrils daily.     No current facility-administered medications for this visit.    REVIEW OF SYSTEMS:   A 10+ POINT REVIEW OF SYSTEMS WAS OBTAINED including neurology, dermatology, psychiatry, cardiac, respiratory, lymph, extremities, GI, GU, Musculoskeletal, constitutional, breasts, reproductive, HEENT.  All pertinent positives are noted in  the HPI.  All others are negative.   PHYSICAL EXAMINATION: ECOG FS:1 - Symptomatic but completely ambulatory  Vitals:   06/15/20 1400  BP: (!) 167/80  Pulse: 90  Resp: 18  Temp: 98.2 F (36.8 C)  SpO2: 100%   Wt Readings from Last 3 Encounters:  06/15/20 191 lb (86.6 kg)  12/15/19 186 lb 14.4 oz (84.8 kg)  08/15/19 182 lb 4.8 oz (82.7 kg)   Body mass index is 26.64 kg/m.    GENERAL:alert, in no acute distress and comfortable SKIN: no acute rashes, no significant lesions EYES: conjunctiva are pink and non-injected, sclera anicteric OROPHARYNX: MMM, no exudates, no oropharyngeal erythema or ulceration NECK: supple, no JVD LYMPH:  no palpable lymphadenopathy in the cervical, axillary or inguinal regions LUNGS: clear to auscultation b/l with normal respiratory effort HEART: regular rate & rhythm ABDOMEN:  normoactive bowel sounds , non tender, not distended. No palpable hepatosplenomegaly.  Extremity: no pedal edema PSYCH: alert & oriented x 3 with fluent speech NEURO: no focal motor/sensory deficits  LABORATORY DATA:  I have reviewed the data as listed  . CBC Latest Ref  Rng & Units 06/15/2020 12/15/2019 08/15/2019  WBC 4.0 - 10.5 K/uL 5.9 5.8 5.0  Hemoglobin 13.0 - 17.0 g/dL 14.3 14.9 14.5  Hematocrit 39 - 52 % 40.8 42.0 40.7  Platelets 150 - 400 K/uL 138(L) 140(L) 118(L)   CBC    Component Value Date/Time   WBC 5.9 06/15/2020 1328   RBC 4.43 06/15/2020 1328   HGB 14.3 06/15/2020 1328   HGB 13.9 02/25/2018 1312   HGB 11.0 (L) 08/31/2017 1101   HCT 40.8 06/15/2020 1328   HCT 33.8 (L) 08/31/2017 1101   PLT 138 (L) 06/15/2020 1328   PLT 111 (L) 02/25/2018 1312   PLT 84 (L) 08/31/2017 1101   MCV 92.1 06/15/2020 1328   MCV 91.1 08/31/2017 1101   MCH 32.3 06/15/2020 1328   MCHC 35.0 06/15/2020 1328   RDW 15.2 06/15/2020 1328   RDW 17.3 (H) 08/31/2017 1101   LYMPHSABS 1.4 06/15/2020 1328   LYMPHSABS 11.4 (H) 08/31/2017 1101   MONOABS 0.5 06/15/2020 1328   MONOABS 0.7 08/31/2017 1101   EOSABS 0.1 06/15/2020 1328   EOSABS 0.1 08/31/2017 1101   BASOSABS 0.0 06/15/2020 1328   BASOSABS 0.0 08/31/2017 1101    . CMP Latest Ref Rng & Units 06/15/2020 12/15/2019 08/15/2019  Glucose 70 - 99 mg/dL 132(H) 123(H) 124(H)  BUN 8 - 23 mg/dL 19 15 20   Creatinine 0.61 - 1.24 mg/dL 1.03 0.97 0.99  Sodium 135 - 145 mmol/L 139 138 140  Potassium 3.5 - 5.1 mmol/L 4.0 3.9 4.1  Chloride 98 - 111 mmol/L 105 106 104  CO2 22 - 32 mmol/L 27 22 25   Calcium 8.9 - 10.3 mg/dL 9.3 8.9 8.9  Total Protein 6.5 - 8.1 g/dL 7.5 7.7 7.4  Total Bilirubin 0.3 - 1.2 mg/dL 0.6 0.7 0.7  Alkaline Phos 38 - 126 U/L 59 65 55  AST 15 - 41 U/L 26 28 26   ALT 0 - 44 U/L 34 41 38     -flow cytometry consistent with marginal zone lymphoma vs splenic Lymphoma      RADIOGRAPHIC STUDIES: I have personally reviewed the radiological images as listed and agreed with the findings in the report. DG Knee 3 Views Left  Result Date: 06/15/2020 CLINICAL DATA:  Fall with knee pain EXAM: LEFT KNEE - 3 VIEW COMPARISON:  None. FINDINGS: No evidence of fracture. No joint effusion.  Prepatellar  soft tissue swelling. No evidence of underlying patellar bone injury. IMPRESSION: Prepatellar soft tissue swelling. No evidence of fracture or joint effusion. Electronically Signed   By: Nelson Chimes M.D.   On: 06/15/2020 15:04    ASSESSMENT & PLAN:   CHORD TAKAHASHI is a  69 y.o. male who presents to our clinic to discuss:  #1: Splenic marginal Zone lymphoma  Initially presented with Splenomegaly with worsening leukocytosis/lymphocytosis, anemia, and thrombocytopenia He completed 4/4 cycles of Rituxan 09/11/17-10/02/17.   US abdomen from 11/20/17 revealed Splenomegaly with splenic volume of 719 cubic cm, compared with PET-CT volume of 1100 cubic cm. 2. Cholelithiasis with no sonographic evidence for cholecystitis or biliary dilatation 3. Small cysts in the kidneys 4. Slight increased hepatic echogenicity, possible mild steatosis.  S/p 6 maintenance cycles of Rituxan every 2 months, completed March 2020  #2 Elevated BP #3 Raynaud's  Syndrome   PLAN: -Discussed pt labwork today, 06/15/20; WBC & Hgb are nml, mild thrombocytopenia, blood chemistries are nml, LDH is WNL -No lab or clinical evidence of SMZL recurrence at this time. -Will continue watchful observation. No indication to treat at this time. -Recommend pt receive his Shingles vaccine with PCP. -Will get left knee XR today to r/o fracture -Will refer to Orthopedics based on XR -Will see back in 6 months with labs   FOLLOW UP: X ray left knee today RTC with Dr Irene Limbo with labs in 6 months   The total time spent in the appt was 20 minutes and more than 50% was on counseling and direct patient cares.  All of the patient's questions were answered with apparent satisfaction. The patient knows to call the clinic with any problems, questions or concerns.   Sullivan Lone MD Benjamin Perez AAHIVMS [facility] Hematology/Oncology Physician [facility]  (Office):       [phone]  (Work cell):  [phone] (Fax):            [phone]  06/15/2020 4:10 PM   I, Yevette Edwards, am acting as a scribe for Dr. Sullivan Lone.   .I have reviewed the above documentation for accuracy and completeness, and I agree with the above. Brunetta Genera MD

## 2020-06-17 ENCOUNTER — Telehealth: Payer: Self-pay

## 2020-06-17 NOTE — Telephone Encounter (Signed)
Called spoke with pt concerning xray results to knee relayed new recommendations to ice and follow up with pcp

## 2020-12-03 ENCOUNTER — Telehealth: Payer: Self-pay | Admitting: Hematology

## 2020-12-03 NOTE — Telephone Encounter (Signed)
Left message with rescheduled upcoming appointment due to provider's PAL. Gave option to call back to reschedule if needed. 

## 2020-12-13 ENCOUNTER — Inpatient Hospital Stay: Payer: Medicare Other

## 2020-12-13 ENCOUNTER — Inpatient Hospital Stay: Payer: Medicare Other | Admitting: Hematology

## 2020-12-22 ENCOUNTER — Other Ambulatory Visit (HOSPITAL_BASED_OUTPATIENT_CLINIC_OR_DEPARTMENT_OTHER): Payer: Self-pay

## 2020-12-22 ENCOUNTER — Ambulatory Visit: Payer: Medicare Other | Attending: Internal Medicine

## 2020-12-22 ENCOUNTER — Other Ambulatory Visit: Payer: Self-pay

## 2020-12-22 DIAGNOSIS — Z23 Encounter for immunization: Secondary | ICD-10-CM

## 2020-12-22 MED ORDER — PFIZER-BIONT COVID-19 VAC-TRIS 30 MCG/0.3ML IM SUSP
INTRAMUSCULAR | 0 refills | Status: AC
Start: 2020-12-22 — End: ?
  Filled 2020-12-22: qty 0.3, 1d supply, fill #0

## 2020-12-22 NOTE — Progress Notes (Signed)
   Covid-19 Vaccination Clinic  Name:  Jared Nixon    MRN: [id number] DOB: 09-Jul-1951  12/22/2020  [name] was observed post Covid-19 immunization for 15 minutes without incident. He was provided with Vaccine Information Sheet and instruction to access the V-Safe system.   [name] was instructed to call 911 with any severe reactions post vaccine: [name] Kitchen Difficulty breathing  . Swelling of face and throat  . A fast heartbeat  . A bad rash all over body  . Dizziness and weakness   Immunizations Administered    Name Date Dose VIS Date Route   PFIZER Comrnaty(Gray TOP) Covid-19 Vaccine 12/22/2020  2:24 PM 0.3 mL 08/05/2020 Intramuscular   Manufacturer: [place]   Lot: [id number]   Horatio: [id number]

## 2020-12-27 ENCOUNTER — Other Ambulatory Visit: Payer: Self-pay

## 2020-12-27 DIAGNOSIS — C8307 Small cell B-cell lymphoma, spleen: Secondary | ICD-10-CM

## 2020-12-27 NOTE — Progress Notes (Signed)
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 male who has been referred to [place] by Dr Redmond Pulling, [name], MD for evaluation and management of new splenomegaly with lymphocytosis.  Initially, the patient presented into his PCP's office on 06/11/17 complaining of a small lump under his left lower ribcage which had been present for several weeks. This was palpable, per his PCP's notes and Dr Redmond Pulling subsequently ordered a CT A/P to evaluate this further. This was performed on 06/13/17 which confirmed the presence of splenomegaly and portal caval lymphadenopathy which was concerning for lymphoproliferative disease. He also had lab work (summarized as below) which also showed some gradual leukocytosis/lymphocytosis, with worsening anemia and thrombocytopenia. He denies any pain over the area of his splenomegaly, difficulty eating or premature satiation.   In a general sense, he had not had an significant chronic health issues which he is being treated for. He does struggle with some tinnitus involving only the left ear which began around the end of July and has somewhat worsened since. One year ago he also had bilateral inguinal hernia surgery and a basal cell carcinoma excision. He also notes in the 80s that he had a gynecomastia removed, but otherwise of recently he has been feeling at his baseline state-of-health. He was initially informed that he suffered from leukocytosis earlier this year by Dr Dois Davenport office.   He did smoke for a brief time several decades ago in his 74s (0.5ppd), but none since. He does drink alcohol, and on average he drinks 7-8 beers a week. He denies any chemical/radiaiton exposures or exposure during his career. He does not partake in illicit drug usage. He  has never previously needed a blood transfusion in the past.   He is currently prescribed Clonazepam and Ritalin to use on a daily basis which are both managed by Christain Sacramento, MD. He takes these for dealing with clutter/hoarding issues ongoing within his personal life.   Of note, the patient reports that his father did have bladder cancer. His paternal Aunts all were diagnosed with cancer, but he is unsure of which types. He does have a h/o CVA on his mothers side, but nothing else of note.   On ROS, he does report a 10lbs weight loss which was observed by Dr Redmond Pulling and was attributed to his usage of Ritalin. He also notes that he does suffer from sleeping issues. He does experience fatigue intermittently, however, this is not debilitating or hindering his daily activities. He denies fever, chills, night sweats, abdominal pain, nausea, vomiting, diarrhea, rash, or any other associated symptoms.    INTERVAL HISTORY:  Jared Nixon is here for management and evaluation of his SMZL. The patient's last visit with [place] was on 06/15/2020. The pt reports that he is doing well overall.  The pt reports that he has been doing fairly well since the last visit. The pt notes he visited his PCP a few weeks ago and said his triglycerides were very high. The pt notes that he is not keen to the Lipitor medication due to it potentially causing diabetes, as the pt notes that he currently already has pre-diabetes. The pt notes that he will contact his PCP regarding this matter soon. The pt notes that  he received his second booster last week at [facility]. The pt notes that he sees his PCP every six months and will have the next visit in 5 months. The pt notes his PCP does not do regular bloodwork. The pt notes that he has varicocele and this is worsened in the summertime when it is normal.  Lab results today 12/28/2020 of CBC w/diff and CMP is as follows: all values are WNL except for RBC of 4.20, Plt of 135K. CMP  pending. 12/28/2020 LDH pending.  On review of systems, pt reports intermittent chronic knee pain and denies sudden weight loss, fevers, chills, night sweats, abdominal pain, leg swelling, acute back pain, decreased appetite, new lumps/bumps, change in energy, change in bowel habits, acute testicular pain/swelling, and any other symptoms.    MEDICAL HISTORY:  Past Medical History:  Diagnosis Date  . Sinus complaint   . Skin cancer     SURGICAL HISTORY: Past Surgical History:  Procedure Laterality Date  . basal cell carcinoma surgery    . CATARACT EXTRACTION      SOCIAL HISTORY: Social History   Socioeconomic History  . Marital status: Single    Spouse name: Not on file  . Number of children: Not on file  . Years of education: Not on file  . Highest education level: Not on file  Occupational History  . Not on file  Tobacco Use  . Smoking status: Never Smoker  . Smokeless tobacco: Never Used  Vaping Use  . Vaping Use: Never used  Substance and Sexual Activity  . Alcohol use: Yes    Comment: occasional  . Drug use: No  . Sexual activity: Not on file  Other Topics Concern  . Not on file  Social History Narrative  . Not on file   Social Determinants of Health   Financial Resource Strain: Not on file  Food Insecurity: Not on file  Transportation Needs: Not on file  Physical Activity: Not on file  Stress: Not on file  Social Connections: Not on file  Intimate Partner Violence: Not on file   He is originally from [place], [place].   FAMILY HISTORY: No family history on file.  ALLERGIES:  is allergic to methylphenidate, penicillins, pramipexole, and fenofibrate.  MEDICATIONS:  Current Outpatient Medications  Medication Sig Dispense Refill  . amLODipine (NORVASC) 5 MG tablet Take 1 tablet (5 mg total) by mouth daily. 30 tablet 0  . clonazePAM (KLONOPIN) 1 MG tablet TAKE 1 TABLET TWICE A DAY AS NEEDED FOR ANXIETY OR INSOMNIA    . COVID-19 mRNA Vac-TriS,  Pfizer, (PFIZER-BIONT COVID-19 VAC-TRIS) SUSP injection Inject into the muscle. 0.3 mL 0  . diclofenac Sodium (VOLTAREN) 1 % GEL diclofenac 1 % topical gel  APPLY 2 GRAM TO THE AFFECTED AREA(S) BY TOPICAL ROUTE 4 TIMES PER DAY    . fluticasone (FLONASE) 50 MCG/ACT nasal spray Place 1 spray into both nostrils daily.     No current facility-administered medications for this visit.    REVIEW OF SYSTEMS:   10 Point review of Systems was done is negative except as noted above.  PHYSICAL EXAMINATION: ECOG FS:1 - Symptomatic but completely ambulatory  Vitals:   12/28/20 1507  BP: (!) 141/75  Pulse: 71  Resp: 17  Temp: (!) 97.4 F (36.3 C)  SpO2: 100%   Wt Readings from Last 3 Encounters:  12/28/20 188 lb 9.6 oz (85.5 kg)  06/15/20 191 lb (86.6 kg)  12/15/19 186 lb 14.4 oz (84.8 kg)  Body mass index is 26.3 kg/m.     GENERAL:alert, in no acute distress and comfortable SKIN: no acute rashes, no significant lesions EYES: conjunctiva are pink and non-injected, sclera anicteric OROPHARYNX: MMM, no exudates, no oropharyngeal erythema or ulceration NECK: supple, no JVD LYMPH:  no palpable lymphadenopathy in the cervical, axillary or inguinal regions LUNGS: clear to auscultation b/l with normal respiratory effort HEART: regular rate & rhythm ABDOMEN:  normoactive bowel sounds , non tender, not distended. No palpable hepatosplenomegaly.  Extremity: no pedal edema PSYCH: alert & oriented x 3 with fluent speech NEURO: no focal motor/sensory deficits  LABORATORY DATA:  I have reviewed the data as listed  . CBC Latest Ref Rng & Units 12/28/2020 06/15/2020 12/15/2019  WBC 4.0 - 10.5 K/uL 5.9 5.9 5.8  Hemoglobin 13.0 - 17.0 g/dL 13.6 14.3 14.9  Hematocrit 39.0 - 52.0 % 39.0 40.8 42.0  Platelets 150 - 400 K/uL 135(L) 138(L) 140(L)   CBC    Component Value Date/Time   WBC 5.9 12/28/2020 1441   WBC 5.9 06/15/2020 1328   RBC 4.20 (L) 12/28/2020 1441   HGB 13.6 12/28/2020 1441   HGB  11.0 (L) 08/31/2017 1101   HCT 39.0 12/28/2020 1441   HCT 33.8 (L) 08/31/2017 1101   PLT 135 (L) 12/28/2020 1441   PLT 84 (L) 08/31/2017 1101   MCV 92.9 12/28/2020 1441   MCV 91.1 08/31/2017 1101   MCH 32.4 12/28/2020 1441   MCHC 34.9 12/28/2020 1441   RDW 14.7 12/28/2020 1441   RDW 17.3 (H) 08/31/2017 1101   LYMPHSABS 1.5 12/28/2020 1441   LYMPHSABS 11.4 (H) 08/31/2017 1101   MONOABS 0.5 12/28/2020 1441   MONOABS 0.7 08/31/2017 1101   EOSABS 0.1 12/28/2020 1441   EOSABS 0.1 08/31/2017 1101   BASOSABS 0.0 12/28/2020 1441   BASOSABS 0.0 08/31/2017 1101    . CMP Latest Ref Rng & Units 06/15/2020 12/15/2019 08/15/2019  Glucose 70 - 99 mg/dL 132(H) 123(H) 124(H)  BUN 8 - 23 mg/dL 19 15 20   Creatinine 0.61 - 1.24 mg/dL 1.03 0.97 0.99  Sodium 135 - 145 mmol/L 139 138 140  Potassium 3.5 - 5.1 mmol/L 4.0 3.9 4.1  Chloride 98 - 111 mmol/L 105 106 104  CO2 22 - 32 mmol/L 27 22 25   Calcium 8.9 - 10.3 mg/dL 9.3 8.9 8.9  Total Protein 6.5 - 8.1 g/dL 7.5 7.7 7.4  Total Bilirubin 0.3 - 1.2 mg/dL 0.6 0.7 0.7  Alkaline Phos 38 - 126 U/L 59 65 55  AST 15 - 41 U/L 26 28 26   ALT 0 - 44 U/L 34 41 38     -flow cytometry consistent with marginal zone lymphoma vs splenic Lymphoma      RADIOGRAPHIC STUDIES: I have personally reviewed the radiological images as listed and agreed with the findings in the report. No results found.  ASSESSMENT & PLAN:   Jared Nixon is a  70 y.o. male who presents to our clinic to discuss:  #1: Splenic marginal Zone lymphoma  Initially presented with Splenomegaly with worsening leukocytosis/lymphocytosis, anemia, and thrombocytopenia He completed 4/4 cycles of Rituxan 09/11/17-10/02/17.   US abdomen from 11/20/17 revealed Splenomegaly with splenic volume of 719 cubic cm, compared with PET-CT volume of 1100 cubic cm. 2. Cholelithiasis with no sonographic evidence for cholecystitis or biliary dilatation 3. Small cysts in the kidneys 4. Slight increased  hepatic echogenicity, possible mild steatosis.  S/p 6 maintenance cycles of Rituxan every 2 months, completed March 2020  #2  Elevated BP #3 Raynaud's  Syndrome   PLAN: -Discussed pt labwork today, 12/28/2020; blood counts stable, chemistries and LDH pending. -Advised pt that it is natural to have varicocele more prominent in only one testicle and worsened in the summer and warmer weather. Recommended boxer and briefs would enhanced support to increase conformability. -Advised pt that medical grade scrotum support is most likely going to be uncomfortable and is not needed for varicocele. Recommended just supportive boxers. -Recommended pt receive the second COVID booster shot as recently approved. The pt notes he has already received this within the last weeks. -No lab or clinical evidence of SMZL recurrence at this time. -Will continue watchful observation. No indication to treat at this time. -Discussed consolidation of care with PCP assuming full exam and labs needed (CBC / CMP/ LDH if comfortable) . Advised pt to talk to PCP if comfortable with this option.  -Will see back in 6 months with labs. Pt is aware he can reschedule for additional six months if PCP is okay with consolidation of care.   FOLLOW UP: RTC with Dr Irene Limbo with labs in 6 months   The total time spent in the appt was 20 minutes and more than 50% was on counseling and direct patient cares.  All of the patient's questions were answered with apparent satisfaction. The patient knows to call the clinic with any problems, questions or concerns.   Jared Lone MD [place] AAHIVMS [facility] Hematology/Oncology Physician [facility]  (Office):       [phone]  (Work cell):  [phone] (Fax):           [phone]  12/28/2020 3:21 PM   I, Reinaldo Raddle, am acting as scribe for Dr. Sullivan Lone, MD.   .I have reviewed the above documentation for accuracy and completeness, and I agree with the above. Brunetta Genera MD

## 2020-12-28 ENCOUNTER — Other Ambulatory Visit: Payer: Self-pay

## 2020-12-28 ENCOUNTER — Inpatient Hospital Stay (HOSPITAL_BASED_OUTPATIENT_CLINIC_OR_DEPARTMENT_OTHER): Payer: Medicare Other | Admitting: Hematology

## 2020-12-28 ENCOUNTER — Inpatient Hospital Stay: Payer: Medicare Other | Attending: Hematology

## 2020-12-28 VITALS — BP 141/75 | HR 71 | Temp 97.4°F | Resp 17 | Wt 188.6 lb

## 2020-12-28 DIAGNOSIS — Z8572 Personal history of non-Hodgkin lymphomas: Secondary | ICD-10-CM | POA: Insufficient documentation

## 2020-12-28 DIAGNOSIS — I10 Essential (primary) hypertension: Secondary | ICD-10-CM | POA: Insufficient documentation

## 2020-12-28 DIAGNOSIS — I7300 Raynaud's syndrome without gangrene: Secondary | ICD-10-CM | POA: Diagnosis not present

## 2020-12-28 DIAGNOSIS — Z79899 Other long term (current) drug therapy: Secondary | ICD-10-CM | POA: Diagnosis not present

## 2020-12-28 DIAGNOSIS — Z9221 Personal history of antineoplastic chemotherapy: Secondary | ICD-10-CM | POA: Diagnosis not present

## 2020-12-28 DIAGNOSIS — C8307 Small cell B-cell lymphoma, spleen: Secondary | ICD-10-CM

## 2020-12-28 LAB — CMP (CANCER CENTER ONLY)
ALT: 37 U/L (ref 0–44)
AST: 27 U/L (ref 15–41)
Albumin: 4.3 g/dL (ref 3.5–5.0)
Alkaline Phosphatase: 48 U/L (ref 38–126)
Anion gap: 8 (ref 5–15)
BUN: 18 mg/dL (ref 8–23)
CO2: 23 mmol/L (ref 22–32)
Calcium: 9.3 mg/dL (ref 8.9–10.3)
Chloride: 109 mmol/L (ref 98–111)
Creatinine: 0.86 mg/dL (ref 0.61–1.24)
GFR, Estimated: >60 mL/min (ref >60)
Glucose, Bld: 106 mg/dL — ABNORMAL HIGH (ref 70–99)
Potassium: 3.8 mmol/L (ref 3.5–5.1)
Sodium: 140 mmol/L (ref 135–145)
Total Bilirubin: 0.6 mg/dL (ref 0.3–1.2)
Total Protein: 7.9 g/dL (ref 6.5–8.1)

## 2020-12-28 LAB — CBC WITH DIFFERENTIAL (CANCER CENTER ONLY)
Abs Immature Granulocytes: 0.03 10*3/uL (ref 0.00–0.07)
Basophils Absolute: 0 10*3/uL (ref 0.0–0.1)
Basophils Relative: 1 %
Eosinophils Absolute: 0.1 10*3/uL (ref 0.0–0.5)
Eosinophils Relative: 1 %
HCT: 39 % (ref 39.0–52.0)
Hemoglobin: 13.6 g/dL (ref 13.0–17.0)
Immature Granulocytes: 1 %
Lymphocytes Relative: 26 %
Lymphs Abs: 1.5 10*3/uL (ref 0.7–4.0)
MCH: 32.4 pg (ref 26.0–34.0)
MCHC: 34.9 g/dL (ref 30.0–36.0)
MCV: 92.9 fL (ref 80.0–100.0)
Monocytes Absolute: 0.5 10*3/uL (ref 0.1–1.0)
Monocytes Relative: 9 %
Neutro Abs: 3.7 10*3/uL (ref 1.7–7.7)
Neutrophils Relative %: 62 %
Platelet Count: 135 10*3/uL — ABNORMAL LOW (ref 150–400)
RBC: 4.2 MIL/uL — ABNORMAL LOW (ref 4.22–5.81)
RDW: 14.7 % (ref 11.5–15.5)
WBC Count: 5.9 10*3/uL (ref 4.0–10.5)
nRBC: 0 % (ref 0.0–0.2)

## 2020-12-28 LAB — LACTATE DEHYDROGENASE: LDH: 121 U/L (ref 98–192)

## 2020-12-29 ENCOUNTER — Telehealth: Payer: Self-pay | Admitting: Hematology

## 2020-12-29 NOTE — Telephone Encounter (Signed)
Left message with follow-up appointment per 5/3 los. Gave option to call back to reschedule if needed.

## 2021-06-29 ENCOUNTER — Other Ambulatory Visit: Payer: Self-pay

## 2021-06-29 DIAGNOSIS — C8307 Small cell B-cell lymphoma, spleen: Secondary | ICD-10-CM

## 2021-06-30 ENCOUNTER — Inpatient Hospital Stay: Payer: Medicare Other

## 2021-06-30 ENCOUNTER — Other Ambulatory Visit: Payer: Self-pay

## 2021-06-30 ENCOUNTER — Inpatient Hospital Stay: Payer: Medicare Other | Attending: Hematology | Admitting: Hematology

## 2021-06-30 VITALS — BP 161/64 | HR 87 | Temp 97.7°F | Resp 18 | Wt 191.0 lb

## 2021-06-30 DIAGNOSIS — D696 Thrombocytopenia, unspecified: Secondary | ICD-10-CM | POA: Diagnosis not present

## 2021-06-30 DIAGNOSIS — C8307 Small cell B-cell lymphoma, spleen: Secondary | ICD-10-CM

## 2021-06-30 DIAGNOSIS — N281 Cyst of kidney, acquired: Secondary | ICD-10-CM | POA: Insufficient documentation

## 2021-06-30 DIAGNOSIS — D649 Anemia, unspecified: Secondary | ICD-10-CM | POA: Insufficient documentation

## 2021-06-30 DIAGNOSIS — I10 Essential (primary) hypertension: Secondary | ICD-10-CM | POA: Diagnosis not present

## 2021-06-30 DIAGNOSIS — D72820 Lymphocytosis (symptomatic): Secondary | ICD-10-CM | POA: Diagnosis not present

## 2021-06-30 DIAGNOSIS — Z8052 Family history of malignant neoplasm of bladder: Secondary | ICD-10-CM | POA: Insufficient documentation

## 2021-06-30 DIAGNOSIS — R161 Splenomegaly, not elsewhere classified: Secondary | ICD-10-CM | POA: Diagnosis not present

## 2021-06-30 DIAGNOSIS — I7300 Raynaud's syndrome without gangrene: Secondary | ICD-10-CM | POA: Diagnosis not present

## 2021-06-30 LAB — CBC WITH DIFFERENTIAL (CANCER CENTER ONLY)
Abs Immature Granulocytes: 0.04 10*3/uL (ref 0.00–0.07)
Basophils Absolute: 0 10*3/uL (ref 0.0–0.1)
Basophils Relative: 1 %
Eosinophils Absolute: 0.1 10*3/uL (ref 0.0–0.5)
Eosinophils Relative: 1 %
HCT: 39.6 % (ref 39.0–52.0)
Hemoglobin: 14.1 g/dL (ref 13.0–17.0)
Immature Granulocytes: 1 %
Lymphocytes Relative: 30 %
Lymphs Abs: 1.7 10*3/uL (ref 0.7–4.0)
MCH: 33.1 pg (ref 26.0–34.0)
MCHC: 35.6 g/dL (ref 30.0–36.0)
MCV: 93 fL (ref 80.0–100.0)
Monocytes Absolute: 0.4 10*3/uL (ref 0.1–1.0)
Monocytes Relative: 6 %
Neutro Abs: 3.5 10*3/uL (ref 1.7–7.7)
Neutrophils Relative %: 61 %
Platelet Count: 144 10*3/uL — ABNORMAL LOW (ref 150–400)
RBC: 4.26 MIL/uL (ref 4.22–5.81)
RDW: 15.5 % (ref 11.5–15.5)
WBC Count: 5.7 10*3/uL (ref 4.0–10.5)
nRBC: 0 % (ref 0.0–0.2)

## 2021-06-30 LAB — CMP (CANCER CENTER ONLY)
ALT: 34 U/L (ref 0–44)
AST: 25 U/L (ref 15–41)
Albumin: 4 g/dL (ref 3.5–5.0)
Alkaline Phosphatase: 58 U/L (ref 38–126)
Anion gap: 9 (ref 5–15)
BUN: 14 mg/dL (ref 8–23)
CO2: 24 mmol/L (ref 22–32)
Calcium: 8.9 mg/dL (ref 8.9–10.3)
Chloride: 107 mmol/L (ref 98–111)
Creatinine: 0.95 mg/dL (ref 0.61–1.24)
GFR, Estimated: >60 mL/min (ref >60)
Glucose, Bld: 184 mg/dL — ABNORMAL HIGH (ref 70–99)
Potassium: 3.8 mmol/L (ref 3.5–5.1)
Sodium: 140 mmol/L (ref 135–145)
Total Bilirubin: 0.8 mg/dL (ref 0.3–1.2)
Total Protein: 7.5 g/dL (ref 6.5–8.1)

## 2021-06-30 LAB — LACTATE DEHYDROGENASE: LDH: 158 U/L (ref 98–192)

## 2021-07-04 ENCOUNTER — Telehealth: Payer: Self-pay | Admitting: Hematology

## 2021-07-04 NOTE — Telephone Encounter (Signed)
Left message with follow-up appointment per 1/13 los. °

## 2021-07-05 ENCOUNTER — Ambulatory Visit: Payer: Medicare Other

## 2021-07-06 ENCOUNTER — Encounter: Payer: Self-pay | Admitting: Hematology

## 2021-07-06 NOTE — Progress Notes (Addendum)
HEMATOLOGY/ONCOLOGY CLINIC NOTE  Date of Service:  07/07/21    Patient Care Team: Christain Sacramento, MD as PCP - General (Family Medicine)   CHIEF COMPLAINTS/PURPOSE OF CONSULTATION:  F/u for SMZL   HISTORY OF PRESENTING ILLNESS:  Jared Nixon is a wonderful 70 y.o. male who has been referred to [place] by Dr Redmond Pulling, [name], MD for evaluation and management of new splenomegaly with lymphocytosis.  Initially, the patient presented into his PCP's office on 06/11/17 complaining of a small lump under his left lower ribcage which had been present for several weeks. This was palpable, per his PCP's notes and Dr Redmond Pulling subsequently ordered a CT A/P to evaluate this further. This was performed on 06/13/17 which confirmed the presence of splenomegaly and portal caval lymphadenopathy which was concerning for lymphoproliferative disease. He also had lab work (summarized as below) which also showed some gradual leukocytosis/lymphocytosis, with worsening anemia and thrombocytopenia. He denies any pain over the area of his splenomegaly, difficulty eating or premature satiation.   In a general sense, he had not had an significant chronic health issues which he is being treated for. He does struggle with some tinnitus involving only the left ear which began around the end of July and has somewhat worsened since. One year ago he also had bilateral inguinal hernia surgery and a basal cell carcinoma excision. He also notes in the 80s that he had a gynecomastia removed, but otherwise of recently he has been feeling at his baseline state-of-health. He was initially informed that he suffered from leukocytosis earlier this year by Dr Dois Davenport office.   He did smoke for a brief time several decades ago in his 80s (0.5ppd), but none since. He does drink alcohol, and on average he drinks 7-8 beers a week. He denies any chemical/radiaiton exposures or exposure during his career. He does not partake in illicit drug usage. He  has never previously needed a blood transfusion in the past.   He is currently prescribed Clonazepam and Ritalin to use on a daily basis which are both managed by Christain Sacramento, MD. He takes these for dealing with clutter/hoarding issues ongoing within his personal life.   Of note, the patient reports that his father did have bladder cancer. His paternal Aunts all were diagnosed with cancer, but he is unsure of which types. He does have a h/o CVA on his mothers side, but nothing else of note.   On ROS, he does report a 10lbs weight loss which was observed by Dr Redmond Pulling and was attributed to his usage of Ritalin. He also notes that he does suffer from sleeping issues. He does experience fatigue intermittently, however, this is not debilitating or hindering his daily activities. He denies fever, chills, night sweats, abdominal pain, nausea, vomiting, diarrhea, rash, or any other associated symptoms.    INTERVAL HISTORY:  Jared Nixon is here for management and evaluation of his SMZL. The patient's last visit with [place] was on 12/28/2020. The pt reports that he is doing well overall.  He notes no acute new symptoms.  No fevers no chills no night sweats no unexpected weight loss.  No abdominal pain or distention.  No shortness of breath or chest pain. No infection issues. Lab results today 06/30/2021 CBC shows hemoglobin of 14.1 WBC count within normal limits at  5.7k, platelets of 144k CMP within normal limits LDH 158   MEDICAL HISTORY:  Past Medical History:  Diagnosis Date   Sinus complaint  Skin cancer     SURGICAL HISTORY: Past Surgical History:  Procedure Laterality Date   basal cell carcinoma surgery     CATARACT EXTRACTION      SOCIAL HISTORY: Social History   Socioeconomic History   Marital status: Single    Spouse name: Not on file   Number of children: Not on file   Years of education: Not on file   Highest education level: Not on file  Occupational History   Not on  file  Tobacco Use   Smoking status: Never   Smokeless tobacco: Never  Vaping Use   Vaping Use: Never used  Substance and Sexual Activity   Alcohol use: Yes    Comment: occasional   Drug use: No   Sexual activity: Not on file  Other Topics Concern   Not on file  Social History Narrative   Not on file   Social Determinants of Health   Financial Resource Strain: Not on file  Food Insecurity: Not on file  Transportation Needs: Not on file  Physical Activity: Not on file  Stress: Not on file  Social Connections: Not on file  Intimate Partner Violence: Not on file   He is originally from [place], [place].   FAMILY HISTORY: No family history on file.  ALLERGIES:  is allergic to methylphenidate, penicillins, pramipexole, and fenofibrate.  MEDICATIONS:  Current Outpatient Medications  Medication Sig Dispense Refill   amLODipine (NORVASC) 5 MG tablet Take 1 tablet (5 mg total) by mouth daily. 30 tablet 0   clonazePAM (KLONOPIN) 1 MG tablet TAKE 1 TABLET TWICE A DAY AS NEEDED FOR ANXIETY OR INSOMNIA     COVID-19 mRNA Vac-TriS, Pfizer, (PFIZER-BIONT COVID-19 VAC-TRIS) SUSP injection Inject into the muscle. 0.3 mL 0   diclofenac Sodium (VOLTAREN) 1 % GEL diclofenac 1 % topical gel  APPLY 2 GRAM TO THE AFFECTED AREA(S) BY TOPICAL ROUTE 4 TIMES PER DAY     fluticasone (FLONASE) 50 MCG/ACT nasal spray Place 1 spray into both nostrils daily.     No current facility-administered medications for this visit.    REVIEW OF SYSTEMS:   .10 Point review of Systems was done is negative except as noted above.   PHYSICAL EXAMINATION: ECOG FS:1 - Symptomatic but completely ambulatory  Vitals:   06/30/21 1400  BP: (!) 161/64  Pulse: 87  Resp: 18  Temp: 97.7 F (36.5 C)  SpO2: 99%   Wt Readings from Last 3 Encounters:  06/30/21 191 lb (86.6 kg)  12/28/20 188 lb 9.6 oz (85.5 kg)  06/15/20 191 lb (86.6 kg)   Body mass index is 26.64 kg/m.    [name] Kitchen GENERAL:alert, in no acute  distress and comfortable SKIN: no acute rashes, no significant lesions EYES: conjunctiva are pink and non-injected, sclera anicteric OROPHARYNX: MMM, no exudates, no oropharyngeal erythema or ulceration NECK: supple, no JVD LYMPH:  no palpable lymphadenopathy in the cervical, axillary or inguinal regions LUNGS: clear to auscultation b/l with normal respiratory effort HEART: regular rate & rhythm ABDOMEN:  normoactive bowel sounds , non tender, not distended.  No palpable hepatosplenomegaly. Extremity: no pedal edema PSYCH: alert & oriented x 3 with fluent speech NEURO: no focal motor/sensory deficits   LABORATORY DATA:  I have reviewed the data as listed  . CBC Latest Ref Rng & Units 06/30/2021 12/28/2020 06/15/2020  WBC 4.0 - 10.5 K/uL 5.7 5.9 5.9  Hemoglobin 13.0 - 17.0 g/dL 14.1 13.6 14.3  Hematocrit 39.0 - 52.0 % 39.6 39.0 40.8  Platelets  150 - 400 K/uL 144(L) 135(L) 138(L)   CBC    Component Value Date/Time   WBC 5.7 06/30/2021 1342   WBC 5.9 06/15/2020 1328   RBC 4.26 06/30/2021 1342   HGB 14.1 06/30/2021 1342   HGB 11.0 (L) 08/31/2017 1101   HCT 39.6 06/30/2021 1342   HCT 33.8 (L) 08/31/2017 1101   PLT 144 (L) 06/30/2021 1342   PLT 84 (L) 08/31/2017 1101   MCV 93.0 06/30/2021 1342   MCV 91.1 08/31/2017 1101   MCH 33.1 06/30/2021 1342   MCHC 35.6 06/30/2021 1342   RDW 15.5 06/30/2021 1342   RDW 17.3 (H) 08/31/2017 1101   LYMPHSABS 1.7 06/30/2021 1342   LYMPHSABS 11.4 (H) 08/31/2017 1101   MONOABS 0.4 06/30/2021 1342   MONOABS 0.7 08/31/2017 1101   EOSABS 0.1 06/30/2021 1342   EOSABS 0.1 08/31/2017 1101   BASOSABS 0.0 06/30/2021 1342   BASOSABS 0.0 08/31/2017 1101    . CMP Latest Ref Rng & Units 06/30/2021 12/28/2020 06/15/2020  Glucose 70 - 99 mg/dL 184(H) 106(H) 132(H)  BUN 8 - 23 mg/dL 14 18 19   Creatinine 0.61 - 1.24 mg/dL 0.95 0.86 1.03  Sodium 135 - 145 mmol/L 140 140 139  Potassium 3.5 - 5.1 mmol/L 3.8 3.8 4.0  Chloride 98 - 111 mmol/L 107 109 105   CO2 22 - 32 mmol/L 24 23 27   Calcium 8.9 - 10.3 mg/dL 8.9 9.3 9.3  Total Protein 6.5 - 8.1 g/dL 7.5 7.9 7.5  Total Bilirubin 0.3 - 1.2 mg/dL 0.8 0.6 0.6  Alkaline Phos 38 - 126 U/L 58 48 59  AST 15 - 41 U/L 25 27 26   ALT 0 - 44 U/L 34 37 34     -flow cytometry consistent with marginal zone lymphoma vs splenic Lymphoma        RADIOGRAPHIC STUDIES: I have personally reviewed the radiological images as listed and agreed with the findings in the report. No results found.  ASSESSMENT & PLAN:   Jared Nixon is a  70 y.o. male who presents to our clinic to discuss:  #1: Splenic marginal Zone lymphoma  Initially presented with Splenomegaly with worsening leukocytosis/lymphocytosis, anemia, and thrombocytopenia He completed 4/4 cycles of Rituxan 09/11/17-10/02/17.   US abdomen from 11/20/17 revealed Splenomegaly with splenic volume of 719 cubic cm, compared with PET-CT volume of 1100 cubic cm. 2. Cholelithiasis with no sonographic evidence for cholecystitis or biliary dilatation 3. Small cysts in the kidneys 4. Slight increased hepatic echogenicity, possible mild steatosis.  S/p 6 maintenance cycles of Rituxan every 2 months, completed March 2020  #2 Elevated BP #3 Raynaud's  Syndrome   PLAN: -Discussed pt labwork today, Lab results today 06/30/2021 CBC shows hemoglobin of 14.1 WBC count within normal limits at  5.7k, platelets of 144k CMP within normal limits LDH 158 -No lab or clinical evidence of SMZL recurrence at this time. -Will continue watchful observation. No indication to treat at this time. -Will see back in 6 months with labs. Pt is aware he can reschedule for additional six months if PCP is okay with consolidation of care.   FOLLOW UP: RTC with Dr Irene Limbo with labs in 6 months  The total time spent in the appointment was 20 minutes and more than 50% was on counseling and direct patient cares.   All of the patient's questions were answered with apparent satisfaction.  The patient knows to call the clinic with any problems, questions or concerns.   Sullivan Lone MD MS  AAHIVMS [facility] Hematology/Oncology Physician [facility]

## 2021-11-04 ENCOUNTER — Telehealth: Payer: Self-pay | Admitting: Hematology

## 2021-11-04 NOTE — Telephone Encounter (Signed)
Rescheduled upcoming appointment due to provider's schedule. Mailed calendar. ?

## 2021-12-27 ENCOUNTER — Other Ambulatory Visit: Payer: Self-pay | Admitting: *Deleted

## 2021-12-27 DIAGNOSIS — C8307 Small cell B-cell lymphoma, spleen: Secondary | ICD-10-CM

## 2021-12-28 ENCOUNTER — Ambulatory Visit: Payer: Medicare Other | Admitting: Hematology

## 2021-12-28 ENCOUNTER — Other Ambulatory Visit: Payer: Self-pay

## 2021-12-28 ENCOUNTER — Other Ambulatory Visit: Payer: Medicare Other

## 2021-12-28 ENCOUNTER — Inpatient Hospital Stay (HOSPITAL_BASED_OUTPATIENT_CLINIC_OR_DEPARTMENT_OTHER): Payer: Medicare Other | Admitting: Hematology

## 2021-12-28 ENCOUNTER — Inpatient Hospital Stay: Payer: Medicare Other | Attending: Hematology

## 2021-12-28 VITALS — BP 156/89 | HR 86 | Temp 97.9°F | Resp 18 | Ht 71.0 in | Wt 191.9 lb

## 2021-12-28 DIAGNOSIS — C8307 Small cell B-cell lymphoma, spleen: Secondary | ICD-10-CM

## 2021-12-28 DIAGNOSIS — C8300 Small cell B-cell lymphoma, unspecified site: Secondary | ICD-10-CM | POA: Diagnosis present

## 2021-12-28 LAB — CMP (CANCER CENTER ONLY)
ALT: 25 U/L (ref 0–44)
AST: 19 U/L (ref 15–41)
Albumin: 4.1 g/dL (ref 3.5–5.0)
Alkaline Phosphatase: 57 U/L (ref 38–126)
Anion gap: 7 (ref 5–15)
BUN: 17 mg/dL (ref 8–23)
CO2: 28 mmol/L (ref 22–32)
Calcium: 9.4 mg/dL (ref 8.9–10.3)
Chloride: 104 mmol/L (ref 98–111)
Creatinine: 0.95 mg/dL (ref 0.61–1.24)
GFR, Estimated: >60 mL/min (ref >60)
Glucose, Bld: 113 mg/dL — ABNORMAL HIGH (ref 70–99)
Potassium: 4 mmol/L (ref 3.5–5.1)
Sodium: 139 mmol/L (ref 135–145)
Total Bilirubin: 0.6 mg/dL (ref 0.3–1.2)
Total Protein: 7.6 g/dL (ref 6.5–8.1)

## 2021-12-28 LAB — CBC WITH DIFFERENTIAL (CANCER CENTER ONLY)
Abs Immature Granulocytes: 0.03 10*3/uL (ref 0.00–0.07)
Basophils Absolute: 0 10*3/uL (ref 0.0–0.1)
Basophils Relative: 1 %
Eosinophils Absolute: 0.1 10*3/uL (ref 0.0–0.5)
Eosinophils Relative: 2 %
HCT: 41.3 % (ref 39.0–52.0)
Hemoglobin: 14.2 g/dL (ref 13.0–17.0)
Immature Granulocytes: 1 %
Lymphocytes Relative: 30 %
Lymphs Abs: 1.7 10*3/uL (ref 0.7–4.0)
MCH: 32 pg (ref 26.0–34.0)
MCHC: 34.4 g/dL (ref 30.0–36.0)
MCV: 93 fL (ref 80.0–100.0)
Monocytes Absolute: 0.4 10*3/uL (ref 0.1–1.0)
Monocytes Relative: 8 %
Neutro Abs: 3.2 10*3/uL (ref 1.7–7.7)
Neutrophils Relative %: 58 %
Platelet Count: 146 10*3/uL — ABNORMAL LOW (ref 150–400)
RBC: 4.44 MIL/uL (ref 4.22–5.81)
RDW: 14.8 % (ref 11.5–15.5)
WBC Count: 5.5 10*3/uL (ref 4.0–10.5)
nRBC: 0 % (ref 0.0–0.2)

## 2021-12-28 LAB — LACTATE DEHYDROGENASE: LDH: 115 U/L (ref 98–192)

## 2021-12-29 ENCOUNTER — Telehealth: Payer: Self-pay | Admitting: Hematology

## 2021-12-29 NOTE — Telephone Encounter (Signed)
Left message with follow-up appointment per 5/3 los. ?

## 2022-01-03 ENCOUNTER — Encounter: Payer: Self-pay | Admitting: Hematology

## 2022-01-03 NOTE — Progress Notes (Signed)
? ? ?HEMATOLOGY/ONCOLOGY CLINIC NOTE ? ?Date of Service:  12/28/2021 ? ? ?Patient Care Team: ?Christain Sacramento, MD as PCP - General (Family Medicine) ? ? 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 ?HISTORY OF PRESENTING ILLNESS: Please see previous note for details on initial presentation ? ?INTERVAL HISTORY: ? ?Jared Nixon is here for continued evaluation and management of his splenic marginal zone lymphoma.  He notes no acute new symptoms since his last clinic visit 6 months ago.  No fevers no chills no night sweats no unexpected weight loss.  No new shortness of breath or chest pain.  No abdominal pain or distention. ?No abnormal bleeding or bruising.  No new fatigue.  No issues with infections. ?Labs done today were reviewed with the patient in detail. ? ?MEDICAL HISTORY:  ?Past Medical History:  ?Diagnosis Date  ? Sinus complaint   ? Skin cancer   ? ? ?SURGICAL HISTORY: ?Past Surgical History:  ?Procedure Laterality Date  ? basal cell carcinoma surgery    ? CATARACT EXTRACTION    ? ? ?SOCIAL HISTORY: ?Social History  ? ?Socioeconomic History  ? Marital status: Single  ?  Spouse name: Not on file  ? Number of children: Not on file  ? Years of education: Not on file  ? Highest education level: Not on file  ?Occupational History  ? Not on file  ?Tobacco Use  ? Smoking status: Never  ? Smokeless tobacco: Never  ?Vaping Use  ? Vaping Use: Never used  ?Substance and Sexual Activity  ? Alcohol use: Yes  ?  Comment: occasional  ? Drug use: No  ? Sexual activity: Not on file  ?Other Topics Concern  ? Not on file  ?Social History Narrative  ? Not on file  ? ?Social Determinants of Health  ? ?Financial Resource Strain: Not on file  ?Food Insecurity: Not on file  ?Transportation Needs: Not on file  ?Physical Activity: Not on file  ?Stress: Not on file  ?Social Connections: Not on file  ?Intimate Partner Violence: Not on file  ? ?He is  originally from [place], [place].  ? ?FAMILY HISTORY: ?No family history on file. ? ?ALLERGIES:  is allergic to methylphenidate, penicillins, pramipexole, and fenofibrate. ? ?MEDICATIONS:  ?Current Outpatient Medications  ?Medication Sig Dispense Refill  ? amLODipine (NORVASC) 5 MG tablet Take 1 tablet (5 mg total) by mouth daily. 30 tablet 0  ? clonazePAM (KLONOPIN) 1 MG tablet TAKE 1 TABLET TWICE A DAY AS NEEDED FOR ANXIETY OR INSOMNIA    ? COVID-19 mRNA Vac-TriS, Pfizer, (PFIZER-BIONT COVID-19 VAC-TRIS) SUSP injection Inject into the muscle. 0.3 mL 0  ? diclofenac Sodium (VOLTAREN) 1 % GEL diclofenac 1 % topical gel ? APPLY 2 GRAM TO THE AFFECTED AREA(S) BY TOPICAL ROUTE 4 TIMES PER DAY    ? fluticasone (FLONASE) 50 MCG/ACT nasal spray Place 1 spray into both nostrils daily.    ? ?No current facility-administered medications for this visit.  ? ? ?REVIEW OF SYSTEMS:   ?10 Point review of Systems was done is negative except as noted above. ? ?PHYSICAL EXAMINATION: ?ECOG FS:1 - Symptomatic but completely ambulatory ? ?Vitals:  ? 12/28/21 1249  ?BP: (!) 156/89  ?Pulse: 86  ?Resp: 18  ?Temp: 97.9 ?F (36.6 ?C)  ?SpO2: 100%  ? ?Wt Readings from Last 3 Encounters:  ?12/28/21 191 lb 14.4 oz (87 kg)  ?06/30/21 191 lb (86.6 kg)  ?12/28/20 188 lb 9.6  oz (85.5 kg)  ? ?Body mass index is 26.76 kg/m?.   ?. ?NAD ?GENERAL:alert, in no acute distress and comfortable ?SKIN: no acute rashes, no significant lesions ?EYES: conjunctiva are pink and non-injected, sclera anicteric ?OROPHARYNX: MMM, no exudates, no oropharyngeal erythema or ulceration ?NECK: supple, no JVD ?LYMPH:  no palpable lymphadenopathy in the cervical, axillary or inguinal regions ?LUNGS: clear to auscultation b/l with normal respiratory effort ?HEART: regular rate & rhythm ?ABDOMEN:  normoactive bowel sounds , non tender, not distended.  Palpable hepatosplenomegaly ?Extremity: no pedal edema ?PSYCH: alert & oriented x 3 with fluent speech ?NEURO: no focal  motor/sensory deficits ? ? ?LABORATORY DATA:  ?I have reviewed the data as listed ? ?. ? ?  Latest Ref Rng & Units 12/28/2021  ? 12:33 PM 06/30/2021  ?  1:42 PM 12/28/2020  ?  2:41 PM  ?CBC  ?WBC 4.0 - 10.5 K/uL 5.5   5.7   5.9    ?Hemoglobin 13.0 - 17.0 g/dL 14.2   14.1   13.6    ?Hematocrit 39.0 - 52.0 % 41.3   39.6   39.0    ?Platelets 150 - 400 K/uL 146   144   135    ? ?CBC ?   ?Component Value Date/Time  ? WBC 5.5 12/28/2021 1233  ? WBC 5.9 06/15/2020 1328  ? RBC 4.44 12/28/2021 1233  ? HGB 14.2 12/28/2021 1233  ? HGB 11.0 (L) 08/31/2017 1101  ? HCT 41.3 12/28/2021 1233  ? HCT 33.8 (L) 08/31/2017 1101  ? PLT 146 (L) 12/28/2021 1233  ? PLT 84 (L) 08/31/2017 1101  ? MCV 93.0 12/28/2021 1233  ? MCV 91.1 08/31/2017 1101  ? MCH 32.0 12/28/2021 1233  ? MCHC 34.4 12/28/2021 1233  ? RDW 14.8 12/28/2021 1233  ? RDW 17.3 (H) 08/31/2017 1101  ? LYMPHSABS 1.7 12/28/2021 1233  ? LYMPHSABS 11.4 (H) 08/31/2017 1101  ? MONOABS 0.4 12/28/2021 1233  ? MONOABS 0.7 08/31/2017 1101  ? EOSABS 0.1 12/28/2021 1233  ? EOSABS 0.1 08/31/2017 1101  ? BASOSABS 0.0 12/28/2021 1233  ? BASOSABS 0.0 08/31/2017 1101  ? ? ?. ? ?  Latest Ref Rng & Units 12/28/2021  ? 12:33 PM 06/30/2021  ?  1:42 PM 12/28/2020  ?  2:41 PM  ?CMP  ?Glucose 70 - 99 mg/dL 113   184   106    ?BUN 8 - 23 mg/dL '17   14   18    '$ ?Creatinine 0.61 - 1.24 mg/dL 0.95   0.95   0.86    ?Sodium 135 - 145 mmol/L 139   140   140    ?Potassium 3.5 - 5.1 mmol/L 4.0   3.8   3.8    ?Chloride 98 - 111 mmol/L 104   107   109    ?CO2 22 - 32 mmol/L '28   24   23    '$ ?Calcium 8.9 - 10.3 mg/dL 9.4   8.9   9.3    ?Total Protein 6.5 - 8.1 g/dL 7.6   7.5   7.9    ?Total Bilirubin 0.3 - 1.2 mg/dL 0.6   0.8   0.6    ?Alkaline Phos 38 - 126 U/L 57   58   48    ?AST 15 - 41 U/L '19   25   27    '$ ?ALT 0 - 44 U/L 25   34   37    ? ? ? ?-flow cytometry consistent with marginal  zone lymphoma vs splenic Lymphoma ?  ? ?  ? ? ?RADIOGRAPHIC STUDIES: ?I have personally reviewed the radiological images as listed and  agreed with the findings in the report. ?No results found. ? ?ASSESSMENT & PLAN:  ? ?Jared Nixon is a  71 y.o. male who presents to our clinic to discuss: ? ?#1: Splenic marginal Zone lymphoma  ?Initially presented with Splenomegaly with worsening leukocytosis/lymphocytosis, anemia, and thrombocytopenia ?He completed 4/4 cycles of Rituxan 09/11/17-10/02/17.  ? ?US abdomen from 11/20/17 revealed Splenomegaly with splenic volume of 719 cubic cm, compared with PET-CT volume of 1100 cubic cm. 2. Cholelithiasis with no sonographic evidence for cholecystitis or biliary dilatation 3. Small cysts in the kidneys 4. Slight increased hepatic echogenicity, possible mild steatosis. ? ?S/p 6 maintenance cycles of Rituxan every 2 months, completed March 2020 ? ?PLAN: ?-Patients labs done today were discussed in detail with him. ?CBC stable and within normal limits platelets 146k ?CMP within normal limits ?LDH normal at 115 ? ?Patient has no lab or clinical evidence of progression of splenic marginal zone lymphoma at this time. ?No indication for retreatment of the splenic marginal zone lymphoma at this time. ?We will continue to monitor him clinically and with labs every 6 months at this time. ? ? ?FOLLOW UP: ?Return to clinic with Dr. Irene Limbo with labs in 6 months ? ?The total time spent in the appointment was 20 minutes*. ? ?All of the patient's questions were answered with apparent satisfaction. The patient knows to call the clinic with any problems, questions or concerns. ? ? ?Sullivan Lone MD MS AAHIVMS [facility] ?Hematology/Oncology Physician ?[place] ? ?.*Total Encounter Time as defined by the Centers for Medicare and Medicaid Services includes, in addition to the face-to-face time of a patient visit (documented in the note above) non-face-to-face time: obtaining and reviewing outside history, ordering and reviewing medications, tests or procedures, care coordination (communications with other health care  professionals or caregivers) and documentation in the medical record. ? ? ?

## 2022-02-01 ENCOUNTER — Other Ambulatory Visit: Payer: Self-pay | Admitting: Oncology

## 2022-02-24 ENCOUNTER — Ambulatory Visit: Payer: Medicare Other | Attending: Internal Medicine

## 2022-02-24 ENCOUNTER — Other Ambulatory Visit (HOSPITAL_BASED_OUTPATIENT_CLINIC_OR_DEPARTMENT_OTHER): Payer: Self-pay

## 2022-02-24 DIAGNOSIS — Z23 Encounter for immunization: Secondary | ICD-10-CM

## 2022-02-24 MED ORDER — PFIZER COVID-19 VAC BIVALENT 30 MCG/0.3ML IM SUSP
INTRAMUSCULAR | 0 refills | Status: AC
  Filled 2022-02-24: qty 0.3, 1d supply, fill #0

## 2022-02-24 NOTE — Progress Notes (Signed)
   Covid-19 Vaccination Clinic  Name:  Jared Nixon    MRN: [id number] DOB: Oct 29, 1950  02/24/2022  Jared Nixon was observed post Covid-19 immunization for 15 minutes without incident. He was provided with Vaccine Information Sheet and instruction to access the V-Safe system.   Jared Nixon was instructed to call 911 with any severe reactions post vaccine: Difficulty breathing  Swelling of face and throat  A fast heartbeat  A bad rash all over body  Dizziness and weakness   Immunizations Administered     Name Date Dose VIS Date Route   Pfizer Covid-19 Vaccine Bivalent Booster 02/24/2022  5:34 PM 0.3 mL 04/27/2021 Intramuscular   Manufacturer: [employer]   Lot: [id number]   [place]: [phone]

## 2022-03-15 ENCOUNTER — Ambulatory Visit (INDEPENDENT_AMBULATORY_CARE_PROVIDER_SITE_OTHER): Payer: Medicare Other

## 2022-03-15 ENCOUNTER — Ambulatory Visit (INDEPENDENT_AMBULATORY_CARE_PROVIDER_SITE_OTHER): Payer: Medicare Other | Admitting: Podiatry

## 2022-03-15 ENCOUNTER — Encounter: Payer: Self-pay | Admitting: Podiatry

## 2022-03-15 DIAGNOSIS — M7751 Other enthesopathy of right foot: Secondary | ICD-10-CM

## 2022-03-15 DIAGNOSIS — M109 Gout, unspecified: Secondary | ICD-10-CM | POA: Diagnosis not present

## 2022-03-15 DIAGNOSIS — M722 Plantar fascial fibromatosis: Secondary | ICD-10-CM | POA: Diagnosis not present

## 2022-03-15 MED ORDER — TRIAMCINOLONE ACETONIDE 10 MG/ML IJ SUSP
10.0000 mg | Freq: Once | INTRAMUSCULAR | Status: AC
  Administered 2022-03-15: 10 mg

## 2022-03-15 NOTE — Patient Instructions (Signed)
Gout  Gout is a condition that causes painful swelling of the joints. Gout is a type of inflammation of the joints (arthritis). This condition is caused by having too much uric acid in the body. Uric acid is a chemical that forms when the body breaks down substances called purines. Purines are important for building body proteins. When the body has too much uric acid, sharp crystals can form and build up inside the joints. This causes pain and swelling. Gout attacks can happen quickly and may be very painful (acute gout). Over time, the attacks can affect more joints and become more frequent (chronic gout). Gout can also cause uric acid to build up under the skin and inside the kidneys. What are the causes? This condition is caused by too much uric acid in your blood. This can happen because: Your kidneys do not remove enough uric acid from your blood. This is the most common cause. Your body makes too much uric acid. This can happen with some cancers and cancer treatments. It can also occur if your body is breaking down too many red blood cells (hemolytic anemia). You eat too many foods that are high in purines. These foods include organ meats and some seafood. Alcohol, especially beer, is also high in purines. A gout attack may be triggered by trauma or stress. What increases the risk? The following factors may make you more likely to develop this condition: Having a family history of gout. Being male and middle-aged. Being male and having gone through menopause. Taking certain medicines, including aspirin, cyclosporine, diuretics, levodopa, and niacin. Having an organ transplant. Having certain conditions, such as: Being obese. Lead poisoning. Kidney disease. A skin condition called psoriasis. Other factors include: Losing weight too quickly. Being dehydrated. Frequently drinking alcohol, especially beer. Frequently drinking beverages that are sweetened with a type of sugar called  fructose. What are the signs or symptoms? An attack of acute gout happens quickly. It usually occurs in just one joint. The most common place is the big toe. Attacks often start at night. Other joints that may be affected include joints of the feet, ankle, knee, fingers, wrist, or elbow. Symptoms of this condition may include: Severe pain. Warmth. Swelling. Stiffness. Tenderness. The affected joint may be very painful to touch. Shiny, red, or purple skin. Chills and fever. Chronic gout may cause symptoms more frequently. More joints may be involved. You may also have white or yellow lumps (tophi) on your hands or feet or in other areas near your joints. How is this diagnosed? This condition is diagnosed based on your symptoms, your medical history, and a physical exam. You may have tests, such as: Blood tests to measure uric acid levels. Removal of joint fluid with a thin needle (aspiration) to look for uric acid crystals. X-rays to look for joint damage. How is this treated? Treatment for this condition has two phases: treating an acute attack and preventing future attacks. Acute gout treatment may include medicines to reduce pain and swelling, including: NSAIDs, such as ibuprofen. Steroids. These are strong anti-inflammatory medicines that can be taken by mouth (orally) or injected into a joint. Colchicine. This medicine relieves pain and swelling when it is taken soon after an attack. It can be given by mouth or through an IV. Preventive treatment may include: Daily use of smaller doses of NSAIDs or colchicine. Use of a medicine that reduces uric acid levels in your blood, such as allopurinol. Changes to your diet. You may need to see   a dietitian about what to eat and drink to prevent gout. Follow these instructions at home: During a gout attack  If directed, put ice on the affected area. To do this: Put ice in a plastic bag. Place a towel between your skin and the bag. Leave the  ice on for 20 minutes, 2-3 times a day. Remove the ice if your skin turns bright red. This is very important. If you cannot feel pain, heat, or cold, you have a greater risk of damage to the area. Raise (elevate) the affected joint above the level of your heart as often as possible. Rest the joint as much as possible. If the affected joint is in your leg, you may be given crutches to use. Follow instructions from your health care provider about eating or drinking restrictions. Avoiding future gout attacks Follow a low-purine diet as told by your dietitian or health care provider. Avoid foods and drinks that are high in purines, including liver, kidney, anchovies, asparagus, herring, mushrooms, mussels, and beer. Maintain a healthy weight or lose weight if you are overweight. If you want to lose weight, talk with your health care provider. Do not lose weight too quickly. Start or maintain an exercise program as told by your health care provider. Eating and drinking Avoid drinking beverages that contain fructose. Drink enough fluids to keep your urine pale yellow. If you drink alcohol: Limit how much you have to: 0-1 drink a day for women who are not pregnant. 0-2 drinks a day for men. Know how much alcohol is in a drink. In the [place] one drink equals one 12 oz bottle of beer (355 mL), one 5 oz glass of wine (148 mL), or one 1 oz glass of hard liquor (44 mL). General instructions Take over-the-counter and prescription medicines only as told by your health care provider. Ask your health care provider if the medicine prescribed to you requires you to avoid driving or using machinery. Return to your normal activities as told by your health care provider. Ask your health care provider what activities are safe for you. Keep all follow-up visits. This is important. Where to find more information National Institutes of Health: www.niams.nih.gov Contact a health care provider if you have: Another  gout attack. Continuing symptoms of a gout attack after 10 days of treatment. Side effects from your medicines. Chills or a fever. Burning pain when you urinate. Pain in your lower back or abdomen. Get help right away if you: Have severe or uncontrolled pain. Cannot urinate. Summary Gout is painful swelling of the joints caused by having too much uric acid in the body. The most common site for gout to occur is in the big toe, but it can affect other joints in the body. Medicines and dietary changes can help to prevent and treat gout attacks. This information is not intended to replace advice given to you by your health care provider. Make sure you discuss any questions you have with your health care provider. Document Revised: 05/18/2021 Document Reviewed: 05/18/2021 Elsevier Patient Education  2023 Elsevier Inc.  

## 2022-03-15 NOTE — Progress Notes (Signed)
Subjective:   Patient ID: Jared Nixon, male   DOB: 71 y.o.   MRN: [id number]   HPI Patient presents stating he has developed pain in his right big toe joint and is concerned about gout also is having discomfort in his arches bilateral and is seeing another physician and had previous injection treatment which has given him partial success and he is just started wearing orthotics that we had made for him   ROS      Objective:  Physical Exam  Neurovascular status intact with redness inflammation around the first MPJ right moderate discomfort along with moderate flatfoot deformity history of posterior tibial tendinitis and mid arch fasciitis of a moderate nature     Assessment:  Inflammatory capsulitis possibility for gout of the first MPJ along with mid arch for osteoarthritis of bilateral     Plan:  H&P reviewed both conditions and for the fasciitis and I wanted to continue the orthotics do stretching exercises and I want to reevaluate that again in 3 weeks discussing possibly other treatments we could try.  I did go ahead and gave him instructions on gout and foods to watch out for and I went ahead today did sterile prep and injected around the first MPJ right 3 mg dexamethasone Kenalog 5 mg Xylocaine to reduce the inflammation and gave instructions on diet restrictions  X-rays indicate moderate deformity around the first MPJ right no osteolysis or other bone pathology noted with moderate depression of the arch

## 2022-03-29 ENCOUNTER — Telehealth: Payer: Self-pay | Admitting: *Deleted

## 2022-03-29 NOTE — Telephone Encounter (Signed)
Patient is calling for a prescription  sent to pharmacy, still having problems w/ gout flare up.

## 2022-03-30 ENCOUNTER — Other Ambulatory Visit: Payer: Self-pay | Admitting: Podiatry

## 2022-03-30 MED ORDER — METHYLPREDNISOLONE 4 MG PO TBPK: ORAL_TABLET | ORAL | 0 refills | Status: AC

## 2022-03-30 NOTE — Telephone Encounter (Signed)
I put him on a 6 day steroid pack. If not improved I need to see back

## 2022-03-30 NOTE — Telephone Encounter (Signed)
Scheduled 04/05/22,please reschedule

## 2022-04-05 ENCOUNTER — Ambulatory Visit: Payer: Medicare Other | Admitting: Podiatry

## 2022-04-05 ENCOUNTER — Ambulatory Visit (INDEPENDENT_AMBULATORY_CARE_PROVIDER_SITE_OTHER): Payer: Medicare Other | Admitting: Podiatry

## 2022-04-05 ENCOUNTER — Encounter: Payer: Self-pay | Admitting: Podiatry

## 2022-04-05 DIAGNOSIS — M7751 Other enthesopathy of right foot: Secondary | ICD-10-CM | POA: Diagnosis not present

## 2022-04-05 DIAGNOSIS — M76821 Posterior tibial tendinitis, right leg: Secondary | ICD-10-CM | POA: Diagnosis not present

## 2022-04-05 DIAGNOSIS — M109 Gout, unspecified: Secondary | ICD-10-CM | POA: Diagnosis not present

## 2022-04-05 NOTE — Progress Notes (Signed)
  Subjective:  Patient ID: Jared Nixon, male    DOB: Jun 22, 1951,   MRN: [id number]  Chief Complaint  Patient presents with   Joint Pain    Right foot great toe pain , patient also states pain has gotten better since the last visit , patient also discuss some pain not much in the middle of his arch    71 y.o. male presents for follow-up of right great toe gout and capsulitis. He has been in the care of Dr. Paulla Dolly. Relates that is doing better and injection has helped. Currently taking the dose pack. Relates the arch pain is still bothering him and wondering about his orthotics and other treatments. Has been taking antiinflammatories.  Denies any other pedal complaints. Denies n/v/f/c.   Past Medical History:  Diagnosis Date   Sinus complaint    Skin cancer     Objective:  Physical Exam: Vascular: DP/PT pulses 2/4 bilateral. CFT <3 seconds. Normal hair growth on digits. No edema.  Skin. No lacerations or abrasions bilateral feet.  Musculoskeletal: MMT 5/5 bilateral lower extremities in DF, PF, Inversion and Eversion. Deceased ROM in DF of ankle joint.  Neurological: Sensation intact to light touch.   Assessment:   1. Capsulitis of metatarsophalangeal (MTP) joint of right foot   2. Gout involving toe of right foot, unspecified cause, unspecified chronicity   3. Posterior tibial tendon dysfunction (PTTD) of right lower extremity      Plan:  Patient was evaluated and treated and all questions answered. X-rays reviewed and discussed with patient from previous.  Discussed gout capsulitis, pf, and PTTD diagnosis and treatment options with patient. Stretching exercises discussed and handout dispensed. Continue anti-inflammatories  Discussed if there is no improvement PT/MRI/injection may be an option. Patient to return to clinic as needed.    Lorenda Peck, DPM

## 2022-04-05 NOTE — Patient Instructions (Signed)
Posterior Tibial Tendinitis Rehab Ask your health care provider which exercises are safe for you. Do exercises exactly as told by your health care provider and adjust them as directed. It is normal to feel mild stretching, pulling, tightness, or discomfort as you do these exercises. Stop right away if you feel sudden pain or your pain gets worse. Do not begin these exercises until told by your health care provider. Stretching and range-of-motion exercises These exercises warm up your muscles and joints and improve the movement and flexibility in your ankle and foot. These exercises may also help to relieve pain. Standing wall calf stretch, knee straight  Stand with your hands against a wall. Extend your left / right leg behind you, and bend your front knee slightly. If directed, place a folded washcloth under the arch of your foot for support. Point the toes of your back foot slightly inward. Keeping your heels on the floor and your back knee straight, shift your weight toward the wall. Do not allow your back to arch. You should feel a gentle stretch in your upper left / right calf. Hold this position for __________ seconds. Repeat __________ times. Complete this exercise __________ times a day. Standing wall calf stretch, knee bent Stand with your hands against a wall. Extend your left / right leg behind you, and bend your front knee slightly. If directed, place a folded washcloth under the arch of your foot for support. Point the toes of your back foot slightly inward. Unlock your back knee so it is bent. Keep your heels on the floor. You should feel a gentle stretch deep in your lower left / right calf. Hold this position for __________ seconds. Repeat __________ times. Complete this exercise __________ times a day. Strengthening exercises These exercises build strength and endurance in your ankle and foot. Endurance is the ability to use your muscles for a long time, even after they get  tired. Ankle inversion with band Secure one end of a rubber exercise band or tubing to a fixed object, such as a table leg or a pole, that will stay still when the band is pulled. Loop the other end of the band around the middle of your left / right foot. Sit on the floor facing the object with your left / right leg extended. The band or tube should be slightly tense when your foot is relaxed. Leading with your big toe, slowly bring your left / right foot and ankle inward, toward your other foot (inversion). Hold this position for __________ seconds. Slowly return your foot to the starting position. Repeat __________ times. Complete this exercise __________ times a day. Towel curls  Sit in a chair on a non-carpeted surface, and put your feet on the floor. Place a towel in front of your feet. If told by your health care provider, add a __________ weight to the end of the towel. Keeping your heel on the floor, put your left / right foot on the towel. Pull the towel toward you by grabbing the towel with your toes and curling them under. Keep your heel on the floor while you do this. Let your toes relax. Grab the towel with your toes again. Keep going until the towel is completely underneath your foot. Repeat __________ times. Complete this exercise __________ times a day. Balance exercise This exercise improves or maintains your balance. Balance is important in preventing falls. Single leg stand Without wearing shoes, stand near a railing or in a doorway. You may hold   on to the railing or door frame as needed for balance. Stand on your left / right foot. Keep your big toe down on the floor and try to keep your arch lifted. If balancing in this position is too easy, try the exercise with your eyes closed or while standing on a pillow. Hold this position for __________ seconds. Repeat __________ times. Complete this exercise __________ times a day. This information is not intended to replace  advice given to you by your health care provider. Make sure you discuss any questions you have with your health care provider. Document Revised: 12/10/2018 Document Reviewed: 10/16/2018 Elsevier Patient Education  2023 Elsevier Inc.  

## 2022-05-03 ENCOUNTER — Telehealth: Payer: Self-pay | Admitting: *Deleted

## 2022-05-03 NOTE — Telephone Encounter (Signed)
Patient is calling to request an injection, still having problems w/ gout. Please advise/ schedule for f/u appointment.

## 2022-06-09 ENCOUNTER — Ambulatory Visit (INDEPENDENT_AMBULATORY_CARE_PROVIDER_SITE_OTHER): Payer: Medicare Other | Admitting: Podiatry

## 2022-06-09 ENCOUNTER — Other Ambulatory Visit: Payer: Self-pay | Admitting: Podiatry

## 2022-06-09 DIAGNOSIS — M76821 Posterior tibial tendinitis, right leg: Secondary | ICD-10-CM

## 2022-06-09 MED ORDER — COLCHICINE 0.6 MG PO TABS: 0.6000 mg | ORAL_TABLET | Freq: Every day | ORAL | 0 refills | Status: DC

## 2022-06-09 NOTE — Progress Notes (Signed)
  Subjective:  Patient ID: Jared Nixon, male    DOB: [birth year],  MRN: [id number]  Chief Complaint  Patient presents with   Foot Pain    71 y.o. male presents with the above complaint.  Patient presents with a new complaint of right medial foot pain that has been going for quite some time is progressive gotten worse hurts with ambulation hurts with pressure.  He wanted to discuss treatment options for this he has not seen anyone else prior to seeing me for this.  Pain on palpation pain scale is 5 out of 10 sharp shooting in nature   Review of Systems: Negative except as noted in the HPI. Denies N/V/F/Ch.  Past Medical History:  Diagnosis Date   Sinus complaint    Skin cancer     Current Outpatient Medications:    colchicine 0.6 MG tablet, Take 1 tablet (0.6 mg total) by mouth daily., Disp: 30 tablet, Rfl: 0   amLODipine (NORVASC) 5 MG tablet, Take 1 tablet by mouth daily., Disp: , Rfl:    clonazePAM (KLONOPIN) 0.5 MG tablet, Klonopin, Disp: , Rfl:    COVID-19 mRNA bivalent vaccine, Pfizer, (PFIZER COVID-19 VAC BIVALENT) injection, Inject into the muscle., Disp: 0.3 mL, Rfl: 0   COVID-19 mRNA Vac-TriS, Pfizer, (PFIZER-BIONT COVID-19 VAC-TRIS) SUSP injection, Inject into the muscle., Disp: 0.3 mL, Rfl: 0   fluticasone (FLONASE) 50 MCG/ACT nasal spray, Place 1 spray into both nostrils daily., Disp: , Rfl:    methylPREDNISolone (MEDROL DOSEPAK) 4 MG TBPK tablet, follow package directions, Disp: 21 tablet, Rfl: 0  Social History   Tobacco Use  Smoking Status Never  Smokeless Tobacco Never    Allergies  Allergen Reactions   Methylphenidate Other (See Comments)    raynaud's syndrome worsened.   Penicillins Hives   Pramipexole Other (See Comments)    unknown   Fenofibrate Rash   Objective:  There were no vitals filed for this visit. There is no height or weight on file to calculate BMI. Constitutional Well developed. Well nourished.  Vascular Dorsalis pedis pulses palpable  bilaterally. Posterior tibial pulses palpable bilaterally. Capillary refill normal to all digits.  No cyanosis or clubbing noted. Pedal hair growth normal.  Neurologic Normal speech. Oriented to person, place, and time. Epicritic sensation to light touch grossly present bilaterally.  Dermatologic Nails well groomed and normal in appearance. No open wounds. No skin lesions.  Orthopedic: Pain along the course of the medial foot.  Pain with resisted plantarflexion inversion of the foot.  No pain with dorsiflexion eversion of the foot no pain along the course of the peroneal tendon Achilles tendon ATFL ligament.  Pain along the course of the posterior tibial tendon including the insertion   Radiographs: None Assessment:   1. Posterior tibial tendinitis of right lower extremity    Plan:  Patient was evaluated and treated and all questions answered.  Right posterior tibial tendinitis -All questions and concerns were discussed with the patient in extensive detail given the amount of pain that he is having he will benefit from cam boot immobilization for 4 weeks.  Patient states and agrees with the plan.  He has a cam boot at home he will place himself in it immediately. -I will plan on doing a steroid injection during next clinical visit if there is no improvement versus an MRI  No follow-ups on file.

## 2022-06-14 ENCOUNTER — Telehealth: Payer: Self-pay | Admitting: *Deleted

## 2022-06-14 NOTE — Telephone Encounter (Signed)
Patient is calling to ask if he can change the colchicine to original  capsules per pharmacy for insurance purposes. Please advise.

## 2022-06-15 NOTE — Telephone Encounter (Signed)
Pharmacy and patient notified.

## 2022-06-15 NOTE — Telephone Encounter (Signed)
Contacted pharmacy for ok on the capsules for the colchicine per physician, left voice message for the patient as well.

## 2022-06-27 ENCOUNTER — Other Ambulatory Visit (HOSPITAL_BASED_OUTPATIENT_CLINIC_OR_DEPARTMENT_OTHER): Payer: Self-pay

## 2022-06-27 MED ORDER — COMIRNATY 30 MCG/0.3ML IM SUSY
PREFILLED_SYRINGE | INTRAMUSCULAR | 0 refills | Status: AC
  Filled 2022-06-27: qty 0.3, 1d supply, fill #0

## 2022-06-29 ENCOUNTER — Other Ambulatory Visit: Payer: Self-pay

## 2022-06-29 DIAGNOSIS — C8307 Small cell B-cell lymphoma, spleen: Secondary | ICD-10-CM

## 2022-06-30 ENCOUNTER — Other Ambulatory Visit: Payer: Self-pay

## 2022-06-30 ENCOUNTER — Inpatient Hospital Stay: Payer: Medicare Other | Attending: Hematology | Admitting: Hematology

## 2022-06-30 ENCOUNTER — Inpatient Hospital Stay: Payer: Medicare Other

## 2022-06-30 VITALS — BP 167/80 | HR 95 | Temp 98.2°F | Resp 15 | Wt 187.5 lb

## 2022-06-30 DIAGNOSIS — C8307 Small cell B-cell lymphoma, spleen: Secondary | ICD-10-CM

## 2022-06-30 DIAGNOSIS — R161 Splenomegaly, not elsewhere classified: Secondary | ICD-10-CM | POA: Insufficient documentation

## 2022-06-30 DIAGNOSIS — C8300 Small cell B-cell lymphoma, unspecified site: Secondary | ICD-10-CM | POA: Insufficient documentation

## 2022-06-30 LAB — CBC WITH DIFFERENTIAL (CANCER CENTER ONLY)
Abs Immature Granulocytes: 0.04 10*3/uL (ref 0.00–0.07)
Basophils Absolute: 0 10*3/uL (ref 0.0–0.1)
Basophils Relative: 1 %
Eosinophils Absolute: 0.1 10*3/uL (ref 0.0–0.5)
Eosinophils Relative: 1 %
HCT: 40.7 % (ref 39.0–52.0)
Hemoglobin: 14.3 g/dL (ref 13.0–17.0)
Immature Granulocytes: 1 %
Lymphocytes Relative: 32 %
Lymphs Abs: 1.9 10*3/uL (ref 0.7–4.0)
MCH: 32.4 pg (ref 26.0–34.0)
MCHC: 35.1 g/dL (ref 30.0–36.0)
MCV: 92.1 fL (ref 80.0–100.0)
Monocytes Absolute: 0.5 10*3/uL (ref 0.1–1.0)
Monocytes Relative: 8 %
Neutro Abs: 3.4 10*3/uL (ref 1.7–7.7)
Neutrophils Relative %: 57 %
Platelet Count: 145 10*3/uL — ABNORMAL LOW (ref 150–400)
RBC: 4.42 MIL/uL (ref 4.22–5.81)
RDW: 15.3 % (ref 11.5–15.5)
WBC Count: 6 10*3/uL (ref 4.0–10.5)
nRBC: 0 % (ref 0.0–0.2)

## 2022-06-30 LAB — CMP (CANCER CENTER ONLY)
ALT: 33 U/L (ref 0–44)
AST: 28 U/L (ref 15–41)
Albumin: 4.3 g/dL (ref 3.5–5.0)
Alkaline Phosphatase: 72 U/L (ref 38–126)
Anion gap: 7 (ref 5–15)
BUN: 21 mg/dL (ref 8–23)
CO2: 28 mmol/L (ref 22–32)
Calcium: 9.5 mg/dL (ref 8.9–10.3)
Chloride: 102 mmol/L (ref 98–111)
Creatinine: 0.92 mg/dL (ref 0.61–1.24)
GFR, Estimated: >60 mL/min (ref >60)
Glucose, Bld: 108 mg/dL — ABNORMAL HIGH (ref 70–99)
Potassium: 4.3 mmol/L (ref 3.5–5.1)
Sodium: 137 mmol/L (ref 135–145)
Total Bilirubin: 0.8 mg/dL (ref 0.3–1.2)
Total Protein: 7.7 g/dL (ref 6.5–8.1)

## 2022-06-30 LAB — URIC ACID: Uric Acid, Serum: 8.2 mg/dL (ref 3.7–8.6)

## 2022-06-30 LAB — LACTATE DEHYDROGENASE: LDH: 149 U/L (ref 98–192)

## 2022-07-06 NOTE — Progress Notes (Signed)
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 He has had some issues with recurrence of his lower extremities no other acute needs. No fevers no chills no night sweats no acute weight loss. No abdominal pain or distention.  No fevers or chills or night sweats. Labs done today were discussed in detail with the patient.   MeDdical history  AL H ISTORY:  Past Medical History:  Diagnosis Date   Sinus complaint    Skin cancer     SURGICAL HISTORY: Past Surgical History:  Procedure Laterality Date   basal cell carcinoma surgery     CATARACT EXTRACTION      SOCIAL HISTORY: Social History   Socioeconomic History   Marital status: Single    Spouse name: Not on file   Number of children: Not on file   Years of education: Not on file   Highest education level: Not on file  Occupational History   Not on file  Tobacco Use   Smoking status: Never   Smokeless tobacco: Never  Vaping Use   Vaping Use: Never used  Substance and Sexual Activity   Alcohol use: Yes    Comment: occasional   Drug use: No   Sexual activity: Not on file  Other Topics Concern   Not on file  Social History Narrative   Not on file   Social Determinants of Health   Financial Resource Strain: Not on file  Food Insecurity: Not on file  Transportation Needs: Not on file  Physical Activity: Not on file  Stress: Not on file  Social Connections: Not on file  Intimate Partner Violence: Not on file   He is originally from [place], [place].   FAMILY HISTORY: No family  history on file.  ALLERGIES:  is allergic to methylphenidate, penicillins, pramipexole, and fenofibrate.  MEDICATIONS:  Current Outpatient Medications  Medication Sig Dispense Refill   amLODipine (NORVASC) 5 MG tablet Take 1 tablet by mouth daily.     clonazePAM (KLONOPIN) 0.5 MG tablet Klonopin     colchicine 0.6 MG tablet Take 1 tablet (0.6 mg total) by mouth daily. 30 tablet 0   fluticasone (FLONASE) 50 MCG/ACT nasal spray Place 1 spray into both nostrils daily.     COVID-19 mRNA bivalent vaccine, Pfizer, (PFIZER COVID-19 VAC BIVALENT) injection Inject into the muscle. 0.3 mL 0   COVID-19 mRNA Vac-TriS, Pfizer, (PFIZER-BIONT COVID-19 VAC-TRIS) SUSP injection Inject into the muscle. 0.3 mL 0   COVID-19 mRNA vaccine 2023-2024 (COMIRNATY) syringe Inject into the muscle. 0.3 mL 0   methylPREDNISolone (MEDROL DOSEPAK) 4 MG TBPK tablet follow package directions 21 tablet 0   No current facility-administered medications for this visit.    REVIEW OF SYSTEMS:   10 Point review of Systems was done is negative except as noted above.  PHYSICAL EXAMINATION: ECOG FS:1 - Symptomatic but completely ambulatory  Vitals:   06/30/22 1254  BP: (!) 167/80  Pulse: 95  Resp: 15  Temp: 98.2 F (36.8 C)  SpO2: 99%   Wt Readings from Last 3 Encounters:  06/30/22 187 lb 8 oz (85 kg)  12/28/21 191  lb 14.4 oz (87 kg)  06/30/21 191 lb (86.6 kg)   Body mass index is 26.15 kg/m.   [name] Kitchen NAD GENERAL:alert, in no acute distress and comfortable SKIN: no acute rashes, no significant lesions EYES: conjunctiva are pink and non-injected, sclera anicteric OROPHARYNX: MMM, no exudates, no oropharyngeal erythema or ulceration NECK: supple, no JVD LYMPH:  no palpable lymphadenopathy in the cervical, axillary or inguinal regions LUNGS: clear to auscultation b/l with normal respiratory effort HEART: regular rate & rhythm ABDOMEN:  normoactive bowel sounds , non tender, not distended.  Palpable  hepatosplenomegaly Extremity: no pedal edema PSYCH: alert & oriented x 3 with fluent speech NEURO: no focal motor/sensory deficits   LABORATORY DATA:  I have reviewed the data as listed  .    Latest Ref Rng & Units 06/30/2022   12:26 PM 12/28/2021   12:33 PM 06/30/2021    1:42 PM  CBC  WBC 4.0 - 10.5 K/uL 6.0  5.5  5.7   Hemoglobin 13.0 - 17.0 g/dL 14.3  14.2  14.1   Hematocrit 39.0 - 52.0 % 40.7  41.3  39.6   Platelets 150 - 400 K/uL 145  146  144    CBC    Component Value Date/Time   WBC 6.0 06/30/2022 1226   WBC 5.9 06/15/2020 1328   RBC 4.42 06/30/2022 1226   HGB 14.3 06/30/2022 1226   HGB 11.0 (L) 08/31/2017 1101   HCT 40.7 06/30/2022 1226   HCT 33.8 (L) 08/31/2017 1101   PLT 145 (L) 06/30/2022 1226   PLT 84 (L) 08/31/2017 1101   MCV 92.1 06/30/2022 1226   MCV 91.1 08/31/2017 1101   MCH 32.4 06/30/2022 1226   MCHC 35.1 06/30/2022 1226   RDW 15.3 06/30/2022 1226   RDW 17.3 (H) 08/31/2017 1101   LYMPHSABS 1.9 06/30/2022 1226   LYMPHSABS 11.4 (H) 08/31/2017 1101   MONOABS 0.5 06/30/2022 1226   MONOABS 0.7 08/31/2017 1101   EOSABS 0.1 06/30/2022 1226   EOSABS 0.1 08/31/2017 1101   BASOSABS 0.0 06/30/2022 1226   BASOSABS 0.0 08/31/2017 1101    .    Latest Ref Rng & Units 06/30/2022   12:26 PM 12/28/2021   12:33 PM 06/30/2021    1:42 PM  CMP  Glucose 70 - 99 mg/dL 108  113  184   BUN 8 - 23 mg/dL '21  17  14   '$ Creatinine 0.61 - 1.24 mg/dL 0.92  0.95  0.95   Sodium 135 - 145 mmol/L 137  139  140   Potassium 3.5 - 5.1 mmol/L 4.3  4.0  3.8   Chloride 98 - 111 mmol/L 102  104  107   CO2 22 - 32 mmol/L '28  28  24   '$ Calcium 8.9 - 10.3 mg/dL 9.5  9.4  8.9   Total Protein 6.5 - 8.1 g/dL 7.7  7.6  7.5   Total Bilirubin 0.3 - 1.2 mg/dL 0.8  0.6  0.8   Alkaline Phos 38 - 126 U/L 72  57  58   AST 15 - 41 U/L '28  19  25   '$ ALT 0 - 44 U/L 33  25  34    . Lab Results  Component Value Date   LDH 149 06/30/2022   40  -flow cytometry consistent with marginal zone  lymphoma vs splenic Lymphoma        RADIOGRAPHIC STUDIES: I have personally reviewed the radiological images as listed and agreed with the findings in the  report. No results found.  ASSESSMENT & PLAN:   Jared Nixon is a  71 y.o. male who presents to our clinic to discuss:  #1: Splenic marginal Zone lymphoma  Initially presented with Splenomegaly with worsening leukocytosis/lymphocytosis, anemia, and thrombocytopenia He completed 4/4 cycles of Rituxan 09/11/17-10/02/17.   US abdomen from 11/20/17 revealed Splenomegaly with splenic volume of 719 cubic cm, compared with PET-CT volume of 1100 cubic cm. 2. Cholelithiasis with no sonographic evidence for cholecystitis or biliary dilatation 3. Small cysts in the kidneys 4. Slight increased hepatic echogenicity, possible mild steatosis.  S/p 6 maintenance cycles of Rituxan every 2 months, completed March 2020  PLAN: Labs done today were discussed in detail with the patient. 14.3 with a WBC count of 6k platelets of 145k , CMP stable LDH within normal limits at 149 -Patient has no lab or clinical evidence of splenic marginal zone lymphoma progression at this time. No indication for additional treatment at this time of the patient's lymphoma.  FOLLOW UP:  RTC with Dr Irene Limbo with labs in 12 months F/u with PCP with labs in 6 months   The total time spent in the appointment was 20 minutes*.  All of the patient's questions were answered with apparent satisfaction. The patient knows to call the clinic with any problems, questions or concerns.   Sullivan Lone MD MS AAHIVMS [facility] Hematology/Oncology Physician [facility]  .*Total Encounter Time as defined by the Centers for Medicare and Medicaid Services includes, in addition to the face-to-face time of a patient visit (documented in the note above) non-face-to-face time: obtaining and reviewing outside history, ordering and reviewing medications, tests or procedures, care  coordination (communications with other health care professionals or caregivers) and documentation in the medical record.

## 2022-07-12 ENCOUNTER — Other Ambulatory Visit: Payer: Self-pay | Admitting: Podiatry

## 2022-07-12 ENCOUNTER — Ambulatory Visit (INDEPENDENT_AMBULATORY_CARE_PROVIDER_SITE_OTHER): Payer: Medicare Other | Admitting: Podiatry

## 2022-07-12 DIAGNOSIS — M76821 Posterior tibial tendinitis, right leg: Secondary | ICD-10-CM

## 2022-07-12 MED ORDER — COLCHICINE 0.6 MG PO CAPS: 0.6000 mg | ORAL_CAPSULE | Freq: Two times a day (BID) | ORAL | 3 refills | Status: DC

## 2022-07-12 NOTE — Progress Notes (Signed)
Subjective:  Patient ID: Jared Nixon, male    DOB: 1951-08-07,  MRN: [id number]  Chief Complaint  Patient presents with   Foot Pain    Pt stated that he still gets some discomfort     71 y.o. male presents with the above complaint.  Patient presents for follow-up of right posterior tibial tendinitis.  The cam boot did help some he still has some residual pain he would like to discuss next treatment plan.   Review of Systems: Negative except as noted in the HPI. Denies N/V/F/Ch.  Past Medical History:  Diagnosis Date   Sinus complaint    Skin cancer     Current Outpatient Medications:    amLODipine (NORVASC) 5 MG tablet, Take 1 tablet by mouth daily., Disp: , Rfl:    clonazePAM (KLONOPIN) 0.5 MG tablet, Klonopin, Disp: , Rfl:    Colchicine 0.6 MG CAPS, TAKE 1 CAPSULE BY MOUTH TWICE A DAY, Disp: 60 capsule, Rfl: 3   colchicine 0.6 MG tablet, Take 1 tablet (0.6 mg total) by mouth daily., Disp: 30 tablet, Rfl: 0   COVID-19 mRNA bivalent vaccine, Pfizer, (PFIZER COVID-19 VAC BIVALENT) injection, Inject into the muscle., Disp: 0.3 mL, Rfl: 0   COVID-19 mRNA Vac-TriS, Pfizer, (PFIZER-BIONT COVID-19 VAC-TRIS) SUSP injection, Inject into the muscle., Disp: 0.3 mL, Rfl: 0   COVID-19 mRNA vaccine 2023-2024 (COMIRNATY) syringe, Inject into the muscle., Disp: 0.3 mL, Rfl: 0   fluticasone (FLONASE) 50 MCG/ACT nasal spray, Place 1 spray into both nostrils daily., Disp: , Rfl:    methylPREDNISolone (MEDROL DOSEPAK) 4 MG TBPK tablet, follow package directions, Disp: 21 tablet, Rfl: 0  Social History   Tobacco Use  Smoking Status Never  Smokeless Tobacco Never    Allergies  Allergen Reactions   Methylphenidate Other (See Comments)    raynaud's syndrome worsened.   Penicillins Hives   Pramipexole Other (See Comments)    unknown   Fenofibrate Rash   Objective:  There were no vitals filed for this visit. There is no height or weight on file to calculate BMI. Constitutional Well  developed. Well nourished.  Vascular Dorsalis pedis pulses palpable bilaterally. Posterior tibial pulses palpable bilaterally. Capillary refill normal to all digits.  No cyanosis or clubbing noted. Pedal hair growth normal.  Neurologic Normal speech. Oriented to person, place, and time. Epicritic sensation to light touch grossly present bilaterally.  Dermatologic Nails well groomed and normal in appearance. No open wounds. No skin lesions.  Orthopedic: Pain along the course of the medial foot.  Pain with resisted plantarflexion inversion of the foot.  No pain with dorsiflexion eversion of the foot no pain along the course of the peroneal tendon Achilles tendon ATFL ligament.  Pain along the course of the posterior tibial tendon including the insertion   Radiographs: None Assessment:   1. Posterior tibial tendinitis of right lower extremity     Plan:  Patient was evaluated and treated and all questions answered.  Right posterior tibial tendinitis -All questions and concerns were discussed with the patient in extensive detail  -Discontinue cam boot and can begin transition to regular shoes. -Clinically patient still has residual pain at this time patient will benefit from steroid injection to help decrease acute inflammatory component associate with pain.  I discussed with the patient the risk of rupture associated with it given that this is near the tendon.  He states understanding like to proceed with injection despite the risks -A steroid injection was performed at right medial foot  at point of maximal tenderness using 1% plain Lidocaine and 10 mg of Kenalog. This was well tolerated.   No follow-ups on file.

## 2022-07-14 ENCOUNTER — Other Ambulatory Visit: Payer: Self-pay | Admitting: Podiatry

## 2022-07-17 NOTE — Telephone Encounter (Signed)
Already sent refill

## 2022-07-18 ENCOUNTER — Encounter: Payer: Self-pay | Admitting: Podiatry

## 2022-07-19 ENCOUNTER — Telehealth: Payer: Self-pay | Admitting: *Deleted

## 2022-07-19 MED ORDER — COLCHICINE 0.6 MG PO CAPS: 0.6000 | ORAL_CAPSULE | Freq: Two times a day (BID) | ORAL | 2 refills | Status: DC

## 2022-07-19 NOTE — Telephone Encounter (Signed)
Called patient to update on the letter received concerning his colchicine and it was too expensive, no answer , left message that another form of the medication Mitigare has been sent to pharmacy on file.

## 2022-07-19 NOTE — Telephone Encounter (Signed)
Patient called and did pick up the colchicine tablets instead of the capsules,will cancel the mitigate, he wanted to let the physician know that for future references per his hematologist Alluripurol is better for him than colchicine

## 2022-09-27 ENCOUNTER — Telehealth: Payer: Self-pay | Admitting: *Deleted

## 2022-09-27 MED ORDER — ALLOPURINOL 100 MG PO TABS: 100.0000 mg | ORAL_TABLET | Freq: Every day | ORAL | 6 refills | Status: DC

## 2022-09-27 NOTE — Telephone Encounter (Addendum)
Patient is calling to request allinopurol instead of the colchicine currently taking and is not helping very well. Please advise.

## 2022-09-27 NOTE — Telephone Encounter (Signed)
What is the dosage for the allopurinol, patient has already picked up colchicine, should discontinue or complete what he has first?Please advise.

## 2022-09-28 MED ORDER — ALLOPURINOL 100 MG PO TABS: 100.0000 mg | ORAL_TABLET | Freq: Every day | ORAL | 6 refills | Status: AC

## 2022-11-07 ENCOUNTER — Other Ambulatory Visit: Payer: Self-pay | Admitting: Podiatry

## 2022-11-10 NOTE — Telephone Encounter (Signed)
Patient called to follow up on a prescription that was sent from his pharmacy to be refilled.  Please advise

## 2023-01-05 ENCOUNTER — Other Ambulatory Visit: Payer: Self-pay | Admitting: Podiatry

## 2023-03-14 ENCOUNTER — Other Ambulatory Visit: Payer: Self-pay | Admitting: Podiatry

## 2023-06-17 ENCOUNTER — Other Ambulatory Visit: Payer: Self-pay | Admitting: Podiatry

## 2023-07-03 ENCOUNTER — Other Ambulatory Visit: Payer: Self-pay

## 2023-07-03 DIAGNOSIS — C8307 Small cell B-cell lymphoma, spleen: Secondary | ICD-10-CM

## 2023-07-04 ENCOUNTER — Inpatient Hospital Stay: Payer: Medicare Other | Attending: Hematology

## 2023-07-04 ENCOUNTER — Inpatient Hospital Stay (HOSPITAL_BASED_OUTPATIENT_CLINIC_OR_DEPARTMENT_OTHER): Payer: Medicare Other | Admitting: Hematology

## 2023-07-04 VITALS — BP 157/75 | HR 86 | Temp 97.7°F | Resp 18 | Wt 184.2 lb

## 2023-07-04 DIAGNOSIS — Z9221 Personal history of antineoplastic chemotherapy: Secondary | ICD-10-CM | POA: Diagnosis not present

## 2023-07-04 DIAGNOSIS — C8307 Small cell B-cell lymphoma, spleen: Secondary | ICD-10-CM

## 2023-07-04 LAB — CMP (CANCER CENTER ONLY)
ALT: 27 U/L (ref 0–44)
AST: 23 U/L (ref 15–41)
Albumin: 4.2 g/dL (ref 3.5–5.0)
Alkaline Phosphatase: 58 U/L (ref 38–126)
Anion gap: 5 (ref 5–15)
BUN: 21 mg/dL (ref 8–23)
CO2: 28 mmol/L (ref 22–32)
Calcium: 9.4 mg/dL (ref 8.9–10.3)
Chloride: 106 mmol/L (ref 98–111)
Creatinine: 0.87 mg/dL (ref 0.61–1.24)
GFR, Estimated: >60 mL/min (ref >60)
Glucose, Bld: 150 mg/dL — ABNORMAL HIGH (ref 70–99)
Potassium: 3.9 mmol/L (ref 3.5–5.1)
Sodium: 139 mmol/L (ref 135–145)
Total Bilirubin: 0.5 mg/dL (ref <1.2)
Total Protein: 7.6 g/dL (ref 6.5–8.1)

## 2023-07-04 LAB — CBC WITH DIFFERENTIAL (CANCER CENTER ONLY)
Abs Immature Granulocytes: 0.02 10*3/uL (ref 0.00–0.07)
Basophils Absolute: 0 10*3/uL (ref 0.0–0.1)
Basophils Relative: 1 %
Eosinophils Absolute: 0.1 10*3/uL (ref 0.0–0.5)
Eosinophils Relative: 1 %
HCT: 36.9 % — ABNORMAL LOW (ref 39.0–52.0)
Hemoglobin: 13.4 g/dL (ref 13.0–17.0)
Immature Granulocytes: 0 %
Lymphocytes Relative: 33 %
Lymphs Abs: 2.1 10*3/uL (ref 0.7–4.0)
MCH: 32.4 pg (ref 26.0–34.0)
MCHC: 36.3 g/dL — ABNORMAL HIGH (ref 30.0–36.0)
MCV: 89.3 fL (ref 80.0–100.0)
Monocytes Absolute: 0.5 10*3/uL (ref 0.1–1.0)
Monocytes Relative: 8 %
Neutro Abs: 3.7 10*3/uL (ref 1.7–7.7)
Neutrophils Relative %: 57 %
Platelet Count: 135 10*3/uL — ABNORMAL LOW (ref 150–400)
RBC: 4.13 MIL/uL — ABNORMAL LOW (ref 4.22–5.81)
RDW: 15.5 % (ref 11.5–15.5)
WBC Count: 6.4 10*3/uL (ref 4.0–10.5)
nRBC: 0 % (ref 0.0–0.2)

## 2023-07-04 LAB — URIC ACID: Uric Acid, Serum: 6.7 mg/dL (ref 3.7–8.6)

## 2023-07-04 LAB — LACTATE DEHYDROGENASE: LDH: 124 U/L (ref 98–192)

## 2023-07-04 NOTE — Progress Notes (Signed)
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 He was last seen by me on 06/30/2022 and reported some issues with recurrence of his lower extremities, but was otherwise doing well overall.  Today, he reports having gout flares mainly in his feet. Patient notes having some discomfort sometimes in his hands/wrist, which he reports could be related to arthritis.   Patient denies any new abdominal pain or fullness. He denies any infection issues, unexplained fever, chill, night sweats, abdominal pain, or leg swelling.   He reports having a fall and subsequently developed a bump in his left elbow. He was seen at [facility] and was not found to have a fracture. He has no new lumps/bumps otherwise.   He reports that he does not take allopurinol regualry. He reports that he periodically takes Colchicine, but when he does take it he takes one tablet daily. He denies starting any other new medications.   He does consume pork, beef, and mushrooms in his diet.   MEDICAL HISTORY: Past Medical History:  Diagnosis Date   Sinus complaint    Skin cancer     SURGICAL HISTORY: Past Surgical History:  Procedure Laterality Date   basal cell carcinoma surgery     CATARACT EXTRACTION      SOCIAL HISTORY: Social History   Socioeconomic History   Marital status: Single    Spouse name: Not on file   Number of children: Not on file   Years of education: Not on file   Highest education level: Not on file  Occupational History   Not on file  Tobacco Use   Smoking status: Never    Smokeless tobacco: Never  Vaping Use   Vaping status: Never Used  Substance and Sexual Activity   Alcohol use: Yes    Comment: occasional   Drug use: No   Sexual activity: Not on file  Other Topics Concern   Not on file  Social History Narrative   Not on file   Social Determinants of Health   Financial Resource Strain: Not on file  Food Insecurity: Not on file  Transportation Needs: Not on file  Physical Activity: Not on file  Stress: Not on file  Social Connections: Not on file  Intimate Partner Violence: Not on file   He is originally from [place], [place].   FAMILY HISTORY: No family history on file.  ALLERGIES:  is allergic to methylphenidate, penicillins, pramipexole, and fenofibrate.  MEDICATIONS:  Current Outpatient Medications  Medication Sig Dispense Refill   allopurinol (ZYLOPRIM) 100 MG tablet Take 1 tablet (100 mg total) by mouth daily. 30 tablet 6   amLODipine (NORVASC) 5 MG tablet Take 1 tablet by mouth daily.     clonazePAM (KLONOPIN) 0.5 MG tablet Klonopin     colchicine 0.6 MG tablet TAKE 1 TABLET BY MOUTH TWICE A DAY 180 tablet 1   COVID-19 mRNA bivalent vaccine, Pfizer, (PFIZER COVID-19 VAC BIVALENT) injection Inject into the muscle. 0.3 mL 0   COVID-19 mRNA Vac-TriS, Pfizer, (PFIZER-BIONT COVID-19 VAC-TRIS) SUSP injection Inject into the muscle. 0.3 mL 0   COVID-19 mRNA vaccine 2023-2024 (COMIRNATY) syringe  Inject into the muscle. 0.3 mL 0   fluticasone (FLONASE) 50 MCG/ACT nasal spray Place 1 spray into both nostrils daily.     methylPREDNISolone (MEDROL DOSEPAK) 4 MG TBPK tablet follow package directions 21 tablet 0   No current facility-administered medications for this visit.    REVIEW OF SYSTEMS:    10 Point review of Systems was done is negative except as noted above.   PHYSICAL EXAMINATION: ECOG FS:1 - Symptomatic but completely ambulatory  There were no vitals filed for this visit.  Wt Readings from Last 3 Encounters:  06/30/22  187 lb 8 oz (85 kg)  12/28/21 191 lb 14.4 oz (87 kg)  06/30/21 191 lb (86.6 kg)   There is no height or weight on file to calculate BMI.    GENERAL:alert, in no acute distress and comfortable SKIN: no acute rashes, no significant lesions EYES: conjunctiva are pink and non-injected, sclera anicteric OROPHARYNX: MMM, no exudates, no oropharyngeal erythema or ulceration NECK: supple, no JVD LYMPH:  no palpable lymphadenopathy in the cervical, axillary or inguinal regions LUNGS: clear to auscultation b/l with normal respiratory effort HEART: regular rate & rhythm ABDOMEN:  normoactive bowel sounds , non tender, not distended. Extremity: no pedal edema PSYCH: alert & oriented x 3 with fluent speech NEURO: no focal motor/sensory deficits   LABORATORY DATA:  I have reviewed the data as listed  .    Latest Ref Rng & Units 06/30/2022   12:26 PM 12/28/2021   12:33 PM 06/30/2021    1:42 PM  CBC  WBC 4.0 - 10.5 K/uL 6.0  5.5  5.7   Hemoglobin 13.0 - 17.0 g/dL 09.8  11.9  14.7   Hematocrit 39.0 - 52.0 % 40.7  41.3  39.6   Platelets 150 - 400 K/uL 145  146  144    CBC    Component Value Date/Time   WBC 6.0 06/30/2022 1226   WBC 5.9 06/15/2020 1328   RBC 4.42 06/30/2022 1226   HGB 14.3 06/30/2022 1226   HGB 11.0 (L) 08/31/2017 1101   HCT 40.7 06/30/2022 1226   HCT 33.8 (L) 08/31/2017 1101   PLT 145 (L) 06/30/2022 1226   PLT 84 (L) 08/31/2017 1101   MCV 92.1 06/30/2022 1226   MCV 91.1 08/31/2017 1101   MCH 32.4 06/30/2022 1226   MCHC 35.1 06/30/2022 1226   RDW 15.3 06/30/2022 1226   RDW 17.3 (H) 08/31/2017 1101   LYMPHSABS 1.9 06/30/2022 1226   LYMPHSABS 11.4 (H) 08/31/2017 1101   MONOABS 0.5 06/30/2022 1226   MONOABS 0.7 08/31/2017 1101   EOSABS 0.1 06/30/2022 1226   EOSABS 0.1 08/31/2017 1101   BASOSABS 0.0 06/30/2022 1226   BASOSABS 0.0 08/31/2017 1101    .    Latest Ref Rng & Units 06/30/2022   12:26 PM 12/28/2021   12:33 PM 06/30/2021    1:42 PM  CMP  Glucose 70 - 99  mg/dL 829  562  130   BUN 8 - 23 mg/dL 21  17  14    Creatinine 0.61 - 1.24 mg/dL 8.65  7.84  6.96   Sodium 135 - 145 mmol/L 137  139  140   Potassium 3.5 - 5.1 mmol/L 4.3  4.0  3.8   Chloride 98 - 111 mmol/L 102  104  107   CO2 22 - 32 mmol/L 28  28  24    Calcium 8.9 - 10.3 mg/dL 9.5  9.4  8.9   Total Protein 6.5 - 8.1 g/dL  7.7  7.6  7.5   Total Bilirubin 0.3 - 1.2 mg/dL 0.8  0.6  0.8   Alkaline Phos 38 - 126 U/L 72  57  58   AST 15 - 41 U/L 28  19  25    ALT 0 - 44 U/L 33  25  34    . Lab Results  Component Value Date   LDH 149 06/30/2022   40  -flow cytometry consistent with marginal zone lymphoma vs splenic Lymphoma        RADIOGRAPHIC STUDIES: I have personally reviewed the radiological images as listed and agreed with the findings in the report. No results found.  ASSESSMENT & PLAN:   Jared Nixon is a  72 y.o. male who presents to our clinic to discuss:  #1: Splenic marginal Zone lymphoma  Initially presented with Splenomegaly with worsening leukocytosis/lymphocytosis, anemia, and thrombocytopenia He completed 4/4 cycles of Rituxan 09/11/17-10/02/17.   US abdomen from 11/20/17 revealed Splenomegaly with splenic volume of 719 cubic cm, compared with PET-CT volume of 1100 cubic cm. 2. Cholelithiasis with no sonographic evidence for cholecystitis or biliary dilatation 3. Small cysts in the kidneys 4. Slight increased hepatic echogenicity, possible mild steatosis.  S/p 6 maintenance cycles of Rituxan every 2 months, completed March 2020  PLAN:  -Discussed lab results on 07/04/23 in detail with patient. CBC stable, showed WBC of 6.4K, hemoglobin of 13.4, and platelets of 135K. -platelets not concerning at this time. Colchicine or allopurinol may be playing a role in counts.  -no increased lymphocytes in the blood -CMP stable -Uric acid levels normal -LDH pending -his spleen is does not appear to be enlarged  -Patient does not have any major new symptoms at this  time -Patient has no lab or clinical evidence of splenic marginal zone lymphoma progression at this time. -No indication for additional treatment at this time for the patient's lymphoma -take allopurinol everyday. Discussed that taking allopurinol intermittently is not very effeective and can trigger more gout flares -okay to take Colchisine for a few days for an acute gout attack. Do not recommend taking it daily.  -continue to stay well hydrated -recommend patient to stay UTD with his age-appropriate vaccines, including flu, RSV, and new COVID-19 vaccine  FOLLOW UP: RTC with Dr Candise Che with labs in 12 months F/u with PCP with labs in 6 months   The total time spent in the appointment was *** minutes* .  All of the patient's questions were answered with apparent satisfaction. The patient knows to call the clinic with any problems, questions or concerns.   Wyvonnia Lora MD MS AAHIVMS [facility] Hematology/Oncology Physician [facility]  .*Total Encounter Time as defined by the Centers for Medicare and Medicaid Services includes, in addition to the face-to-face time of a patient visit (documented in the note above) non-face-to-face time: obtaining and reviewing outside history, ordering and reviewing medications, tests or procedures, care coordination (communications with other health care professionals or caregivers) and documentation in the medical record.    I,Mitra Faeizi,acting as a Neurosurgeon for Wyvonnia Lora, MD.,have documented all relevant documentation on the behalf of Wyvonnia Lora, MD,as directed by  Wyvonnia Lora, MD while in the presence of Wyvonnia Lora, MD.  ***

## 2023-07-06 ENCOUNTER — Telehealth: Payer: Self-pay | Admitting: Hematology

## 2023-07-06 NOTE — Telephone Encounter (Signed)
 Left patient a message in regards to scheduled appointment times/dates

## 2023-07-30 ENCOUNTER — Other Ambulatory Visit: Payer: Self-pay | Admitting: Surgery

## 2023-07-30 DIAGNOSIS — N6341 Unspecified lump in right breast, subareolar: Secondary | ICD-10-CM

## 2023-08-16 ENCOUNTER — Ambulatory Visit: Payer: Medicare Other

## 2023-08-16 ENCOUNTER — Ambulatory Visit
Admission: RE | Admit: 2023-08-16 | Discharge: 2023-08-16 | Disposition: A | Discharge status: Home / Self Care | Payer: Medicare Other | Source: Ambulatory Visit | Attending: Surgery | Admitting: Surgery

## 2023-08-16 DIAGNOSIS — N6341 Unspecified lump in right breast, subareolar: Secondary | ICD-10-CM

## 2023-10-04 ENCOUNTER — Telehealth: Payer: Self-pay | Admitting: Podiatry

## 2023-10-04 NOTE — Telephone Encounter (Signed)
 Patient called asking if he could get a prescription refill before his appointment on 10/24/2023, or will he have to wait until after the appointment. Thank you.

## 2023-10-08 ENCOUNTER — Other Ambulatory Visit: Payer: Self-pay

## 2023-10-08 MED ORDER — COLCHICINE 0.6 MG PO TABS: 0.6000 mg | ORAL_TABLET | Freq: Two times a day (BID) | ORAL | 1 refills | Status: AC

## 2023-10-24 ENCOUNTER — Ambulatory Visit (INDEPENDENT_AMBULATORY_CARE_PROVIDER_SITE_OTHER): Payer: Medicare Other | Admitting: Podiatry

## 2023-10-24 DIAGNOSIS — M7751 Other enthesopathy of right foot: Secondary | ICD-10-CM

## 2023-10-24 DIAGNOSIS — M109 Gout, unspecified: Secondary | ICD-10-CM | POA: Diagnosis not present

## 2023-10-24 MED ORDER — ALLOPURINOL 100 MG PO TABS
100.0000 mg | ORAL_TABLET | Freq: Every day | ORAL | 6 refills | Status: DC
Start: 2023-10-24 — End: 2024-04-14

## 2023-10-24 NOTE — Progress Notes (Unsigned)
  Subjective:  Patient ID: Jared Nixon, male    DOB: 07/03/1951,  MRN: [id number]  Chief Complaint  Patient presents with   Plantar Fasciitis    73 y.o. male presents with the above complaint.  Patient presents with complaint of right foot pain.  Patient like gout flare he has a history of gout but as long as he is taking all.  Has been maintained he ran out of the medication and would like a refill denies any other acute complaints of gout symptoms are manageable and the flareup has decreased   Review of Systems: Negative except as noted in the HPI. Denies N/V/F/Ch.  Past Medical History:  Diagnosis Date   Sinus complaint    Skin cancer     Current Outpatient Medications:    allopurinol (ZYLOPRIM) 100 MG tablet, Take 1 tablet (100 mg total) by mouth daily., Disp: 30 tablet, Rfl: 6   allopurinol (ZYLOPRIM) 100 MG tablet, Take 1 tablet (100 mg total) by mouth daily., Disp: 30 tablet, Rfl: 6   amLODipine (NORVASC) 5 MG tablet, Take 1 tablet by mouth daily., Disp: , Rfl:    clonazePAM (KLONOPIN) 0.5 MG tablet, Klonopin, Disp: , Rfl:    colchicine 0.6 MG tablet, Take 1 tablet (0.6 mg total) by mouth 2 (two) times daily., Disp: 180 tablet, Rfl: 1   COVID-19 mRNA bivalent vaccine, Pfizer, (PFIZER COVID-19 VAC BIVALENT) injection, Inject into the muscle., Disp: 0.3 mL, Rfl: 0   COVID-19 mRNA Vac-TriS, Pfizer, (PFIZER-BIONT COVID-19 VAC-TRIS) SUSP injection, Inject into the muscle., Disp: 0.3 mL, Rfl: 0   COVID-19 mRNA vaccine 2023-2024 (COMIRNATY) syringe, Inject into the muscle., Disp: 0.3 mL, Rfl: 0   fluticasone (FLONASE) 50 MCG/ACT nasal spray, Place 1 spray into both nostrils daily., Disp: , Rfl:    methylPREDNISolone (MEDROL DOSEPAK) 4 MG TBPK tablet, follow package directions, Disp: 21 tablet, Rfl: 0  Social History   Tobacco Use  Smoking Status Never  Smokeless Tobacco Never    Allergies  Allergen Reactions   Methylphenidate Other (See Comments)    raynaud's syndrome  worsened.   Penicillins Hives   Pramipexole Other (See Comments)    unknown   Fenofibrate Rash   Fluoxetine Rash   Objective:  There were no vitals filed for this visit. There is no height or weight on file to calculate BMI. Constitutional Well developed. Well nourished.  Vascular Dorsalis pedis pulses palpable bilaterally. Posterior tibial pulses palpable bilaterally. Capillary refill normal to all digits.  No cyanosis or clubbing noted. Pedal hair growth normal.  Neurologic Normal speech. Oriented to person, place, and time. Epicritic sensation to light touch grossly present bilaterally.  Dermatologic Nails well groomed and normal in appearance. No open wounds. No skin lesions.  Orthopedic: Mild pain on palpation to the right first metatarsophalangeal joint no signs of red hot swollen joint noted.  No other signs of noted   Radiographs: None Assessment:  No diagnosis found. Plan:  Patient was evaluated and treated and all questions answered.  Right foot capsulitis secondary to gouty attack -All questions and concerns were discussed with the patient extensive detail -We discussed diet management -Allopurinol was refilled. -  No follow-ups on file.

## 2024-02-15 ENCOUNTER — Encounter: Payer: Self-pay | Admitting: Medical

## 2024-02-15 ENCOUNTER — Other Ambulatory Visit: Payer: Self-pay | Admitting: Medical

## 2024-02-15 DIAGNOSIS — M25551 Pain in right hip: Secondary | ICD-10-CM

## 2024-02-18 ENCOUNTER — Ambulatory Visit
Admission: RE | Admit: 2024-02-18 | Discharge: 2024-02-18 | Disposition: A | Discharge status: Home / Self Care | Source: Ambulatory Visit | Attending: Medical | Admitting: Medical

## 2024-02-18 DIAGNOSIS — M25551 Pain in right hip: Secondary | ICD-10-CM

## 2024-04-13 ENCOUNTER — Other Ambulatory Visit: Payer: Self-pay | Admitting: Podiatry

## 2024-07-07 ENCOUNTER — Ambulatory Visit: Payer: Medicare Other | Admitting: Hematology

## 2024-07-07 ENCOUNTER — Other Ambulatory Visit: Payer: Medicare Other

## 2024-07-09 ENCOUNTER — Other Ambulatory Visit: Payer: Self-pay

## 2024-07-09 DIAGNOSIS — C8307 Small cell B-cell lymphoma, spleen: Secondary | ICD-10-CM

## 2024-07-11 ENCOUNTER — Inpatient Hospital Stay (HOSPITAL_BASED_OUTPATIENT_CLINIC_OR_DEPARTMENT_OTHER): Payer: Medicare Other | Admitting: Hematology

## 2024-07-11 ENCOUNTER — Inpatient Hospital Stay: Payer: Medicare Other | Attending: Hematology

## 2024-07-11 VITALS — BP 140/64 | HR 80 | Temp 97.8°F | Resp 17 | Ht 70.0 in | Wt 182.0 lb

## 2024-07-11 DIAGNOSIS — C8307 Small cell B-cell lymphoma, spleen: Secondary | ICD-10-CM

## 2024-07-11 DIAGNOSIS — Z9221 Personal history of antineoplastic chemotherapy: Secondary | ICD-10-CM | POA: Insufficient documentation

## 2024-07-11 DIAGNOSIS — Z8572 Personal history of non-Hodgkin lymphomas: Secondary | ICD-10-CM | POA: Insufficient documentation

## 2024-07-11 LAB — CMP (CANCER CENTER ONLY)
ALT: 24 U/L (ref 0–44)
AST: 21 U/L (ref 15–41)
Albumin: 4.2 g/dL (ref 3.5–5.0)
Alkaline Phosphatase: 62 U/L (ref 38–126)
Anion gap: 5 (ref 5–15)
BUN: 21 mg/dL (ref 8–23)
CO2: 28 mmol/L (ref 22–32)
Calcium: 9.4 mg/dL (ref 8.9–10.3)
Chloride: 105 mmol/L (ref 98–111)
Creatinine: 0.9 mg/dL (ref 0.61–1.24)
GFR, Estimated: >60 mL/min (ref >60)
Glucose, Bld: 136 mg/dL — ABNORMAL HIGH (ref 70–99)
Potassium: 4.4 mmol/L (ref 3.5–5.1)
Sodium: 138 mmol/L (ref 135–145)
Total Bilirubin: 0.6 mg/dL (ref 0.0–1.2)
Total Protein: 7.5 g/dL (ref 6.5–8.1)

## 2024-07-11 LAB — LACTATE DEHYDROGENASE: LDH: 115 U/L (ref 105–235)

## 2024-07-11 LAB — CBC WITH DIFFERENTIAL (CANCER CENTER ONLY)
Abs Immature Granulocytes: 0.02 K/uL (ref 0.00–0.07)
Basophils Absolute: 0 K/uL (ref 0.0–0.1)
Basophils Relative: 0 %
Eosinophils Absolute: 0.1 K/uL (ref 0.0–0.5)
Eosinophils Relative: 1 %
HCT: 36.7 % — ABNORMAL LOW (ref 39.0–52.0)
Hemoglobin: 13.2 g/dL (ref 13.0–17.0)
Immature Granulocytes: 0 %
Lymphocytes Relative: 38 %
Lymphs Abs: 3 K/uL (ref 0.7–4.0)
MCH: 32.4 pg (ref 26.0–34.0)
MCHC: 36 g/dL (ref 30.0–36.0)
MCV: 90 fL (ref 80.0–100.0)
Monocytes Absolute: 0.5 K/uL (ref 0.1–1.0)
Monocytes Relative: 6 %
Neutro Abs: 4.2 K/uL (ref 1.7–7.7)
Neutrophils Relative %: 55 %
Platelet Count: 117 K/uL — ABNORMAL LOW (ref 150–400)
RBC: 4.08 MIL/uL — ABNORMAL LOW (ref 4.22–5.81)
RDW: 15.9 % — ABNORMAL HIGH (ref 11.5–15.5)
WBC Count: 7.8 K/uL (ref 4.0–10.5)
nRBC: 0 % (ref 0.0–0.2)

## 2024-07-11 LAB — URIC ACID: Uric Acid, Serum: 5.8 mg/dL (ref 3.7–8.6)

## 2024-07-11 NOTE — Progress Notes (Signed)
 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   SUMMARY OF ONCOLOGIC HISTORY: Oncology History   No history exists.    INTERVAL HISTORY: Jared Nixon is a 73 y.o. 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   he was last seen by me on 07/04/2023; at the time he mentioned experiencing gout flares mainly in his feet.  He reports having a fall and subsequently developed a bump in his left elbow. He was seen at [facility] and was not found to have a fracture.    Today, he has been experiencing some insomnia. He reports experiencing some leg pains but this typically happens at the end of the day. He has experienced pain in his right foot, and has orthotic inserts that he uses. He uses Benadryl  and tylenol  to manage the pain in his feet and legs. He also has been taking motrin once per day for over a year for pain management.   He is concerned about his alcohol consumption. He would like to quit completely. Jared Nixon inquired about Jared Nixon to lose weight and to limit alcohol consumption.   Denies any new infection issues, fevers/chills, drenching night sweats, unexpected weight change, abdominal pain, leg swelling, SOB, or change in breathing. He had no further concerns today.    REVIEW OF SYSTEMS:   10 Point review of systems of done and is negative except as noted above.  MEDICAL HISTORY Past Medical History:  Diagnosis Date   Sinus complaint    Skin cancer     SURGICAL HISTORY Past Surgical History:  Procedure Laterality Date   basal cell carcinoma surgery     CATARACT EXTRACTION      SOCIAL HISTORY Social  History   Tobacco Use   Smoking status: Never   Smokeless tobacco: Never  Vaping Use   Vaping status: Never Used  Substance Use Topics   Alcohol use: Yes    Comment: occasional   Drug use: No    Social History   Social History Narrative   Not on file    SOCIAL DRIVERS OF HEALTH SDOH Screenings   Food Insecurity: Low Risk  (01/09/2024)   Received from [facility]  Housing: Low Risk  (01/09/2024)   Received from [facility]  Transportation Needs: No Transportation Needs (01/09/2024)   Received from [facility]  Utilities: Low Risk  (01/09/2024)   Received from [facility]  Tobacco Use: Medium Risk (01/09/2024)   Received from [facility]     FAMILY HISTORY No family history on file.   ALLERGIES: is allergic to methylphenidate, penicillins, pramipexole, fenofibrate, and fluoxetine.  MEDICATIONS  Current Outpatient Medications  Medication Sig Dispense Refill   colchicine  0.6 MG tablet Take 1 tablet (0.6 mg total) by mouth 2 (two) times daily. 180 tablet 1   allopurinol  (ZYLOPRIM ) 100 MG tablet Take 1 tablet (100 mg total) by mouth daily. 30 tablet 6   allopurinol  (ZYLOPRIM ) 100 MG tablet TAKE 1 TABLET BY MOUTH EVERY DAY 90 tablet 2   amLODipine  (NORVASC ) 5 MG tablet Take 1 tablet by mouth daily.     clonazePAM (KLONOPIN) 0.5 MG tablet Klonopin     COVID-19 mRNA bivalent vaccine, Pfizer, (  PFIZER COVID-19 VAC BIVALENT) injection Inject into the muscle. 0.3 mL 0   COVID-19 mRNA Vac-TriS, Pfizer, (PFIZER-BIONT COVID-19 VAC-TRIS) SUSP injection Inject into the muscle. 0.3 mL 0   COVID-19 mRNA vaccine 2023-2024 (COMIRNATY ) syringe Inject into the muscle. 0.3 mL 0   fluticasone (FLONASE) 50 MCG/ACT nasal spray Place 1 spray into both nostrils daily. (Patient not taking: Reported on 07/11/2024)     methylPREDNISolone  (MEDROL  DOSEPAK) 4 MG TBPK tablet follow package directions (Patient not taking: Reported on 07/11/2024) 21 tablet 0   No current facility-administered  medications for this visit.    PHYSICAL EXAMINATION: ECOG PERFORMANCE STATUS: {CHL ONC ECOG ED:[id number]} VITALS: Vitals:   07/11/24 1348 07/11/24 1354  BP: (!) 158/81 (!) 140/64  Pulse: 80   Resp: 17   Temp: 97.8 F (36.6 C)   SpO2: 96%    Filed Weights   07/11/24 1348  Weight: 182 lb (82.6 kg)   Body mass index is 26.11 kg/m.  GENERAL: alert, in no acute distress and comfortable SKIN: no acute rashes, no significant lesions EYES: conjunctiva are pink and non-injected, sclera anicteric OROPHARYNX: MMM, no exudates, no oropharyngeal erythema or ulceration NECK: supple, no JVD LYMPH:  no palpable lymphadenopathy in the cervical, axillary or inguinal regions LUNGS: clear to auscultation b/l with normal respiratory effort HEART: regular rate & rhythm ABDOMEN:  normoactive bowel sounds , non tender, not distended, no hepatosplenomegaly Extremity: no pedal edema PSYCH: alert & oriented x 3 with fluent speech NEURO: no focal motor/sensory deficits  LABORATORY DATA:   I have reviewed the data as listed     Latest Ref Rng & Units 07/11/2024    1:31 PM 07/04/2023   11:28 AM 06/30/2022   12:26 PM  CBC EXTENDED  WBC 4.0 - 10.5 K/uL 7.8  6.4  6.0   RBC 4.22 - 5.81 MIL/uL 4.08  4.13  4.42   Hemoglobin 13.0 - 17.0 g/dL 86.7  86.5  85.6   HCT 39.0 - 52.0 % 36.7  36.9  40.7   Platelets 150 - 400 K/uL 117  135  145   NEUT# 1.7 - 7.7 K/uL 4.2  3.7  3.4   Lymph# 0.7 - 4.0 K/uL 3.0  2.1  1.9        Latest Ref Rng & Units 07/04/2023   11:28 AM 06/30/2022   12:26 PM 12/28/2021   12:33 PM  CMP  Glucose 70 - 99 mg/dL 849  891  886   BUN 8 - 23 mg/dL 21  21  17    Creatinine 0.61 - 1.24 mg/dL 9.12  9.07  9.04   Sodium 135 - 145 mmol/L 139  137  139   Potassium 3.5 - 5.1 mmol/L 3.9  4.3  4.0   Chloride 98 - 111 mmol/L 106  102  104   CO2 22 - 32 mmol/L 28  28  28    Calcium 8.9 - 10.3 mg/dL 9.4  9.5  9.4   Total Protein 6.5 - 8.1 g/dL 7.6  7.7  7.6   Total Bilirubin <1.2 mg/dL 0.5   0.8  0.6   Alkaline Phos 38 - 126 U/L 58  72  57   AST 15 - 41 U/L 23  28  19    ALT 0 - 44 U/L 27  33  25      RADIOGRAPHIC STUDIES: I have personally reviewed the radiological images as listed and agreed with the findings in the report. No results found.  ASSESSMENT & PLAN:  74 y.o. male with  #1: Splenic marginal Zone lymphoma  Initially presented with Splenomegaly with worsening leukocytosis/lymphocytosis, anemia, and thrombocytopenia He completed 4/4 cycles of Rituxan  09/11/17-10/02/17.    US  abdomen from 11/20/17 revealed Splenomegaly with splenic volume of 719 cubic cm, compared with PET-CT volume of 1100 cubic cm. 2. Cholelithiasis with no sonographic evidence for cholecystitis or biliary dilatation 3. Small cysts in the kidneys 4. Slight increased hepatic echogenicity, possible mild steatosis.   S/p 6 maintenance cycles of Rituxan  every 2 months, completed March 2020  PLAN: - Discussed lab results on 07/11/2024 in detail with patient: -Hemoglobin normal -Total WMC and lymphocytes normal -Platelets have dropped from 135k are 117k  -discussed that motrin can drop platelet counts and long term usage can lead to risks of stomach ulcers and bleeding risks,  -recommended finding alternatives to pain medication (ankle sock, new orthotics, new shoes) -No enlarged lymph nodes and spleen is not enlarged -Recommends seeing primary once a year and looking at blood test results every 6 months -RTC with Dr Onesimo with labs in 12 months F/u with PCP with labs in 6 months    FOLLOW-UP in 6 months for labs and 12 months for follow-up with Dr. Onesimo.  The total time spent in the appointment was *** minutes* .  All of the patient's questions were answered and the patient knows to call the clinic with any problems, questions, or concerns.  Emaline Onesimo MD MS AAHIVMS [facility] Hematology/Oncology Physician [facility]  *Total Encounter Time as defined by the Centers for Medicare  and Medicaid Services includes, in addition to the face-to-face time of a patient visit (documented in the note above) non-face-to-face time: obtaining and reviewing outside history, ordering and reviewing medications, tests or procedures, care coordination (communications with other health care professionals or caregivers) and documentation in the medical record.  I, Alan Blowers, acting as a neurosurgeon for Emaline Onesimo, MD.,have documented all relevant documentation on the behalf of Emaline Onesimo, MD,as directed by  Emaline Onesimo, MD while in the presence of Emaline Onesimo, MD.  I have reviewed the above documentation for accuracy and completeness, and I agree with the above.  Cristin Penaflor, MD

## 2024-09-21 ENCOUNTER — Other Ambulatory Visit: Payer: Self-pay | Admitting: Podiatry

## 2025-07-17 ENCOUNTER — Inpatient Hospital Stay: Admitting: Hematology

## 2025-07-17 ENCOUNTER — Inpatient Hospital Stay
